# Patient Record
Sex: Female | Born: 1960 | Race: Black or African American | Hispanic: No | Marital: Single | Smoking: Current every day smoker
Health system: Southern US, Community
[De-identification: ages and names within clinical notes are randomized; demographics above are authoritative.]

## PROBLEM LIST (undated history)

## (undated) DIAGNOSIS — I2510 Atherosclerotic heart disease of native coronary artery without angina pectoris: Secondary | ICD-10-CM

## (undated) DIAGNOSIS — I252 Old myocardial infarction: Secondary | ICD-10-CM

## (undated) DIAGNOSIS — I10 Essential (primary) hypertension: Secondary | ICD-10-CM

## (undated) HISTORY — PX: CARPAL TUNNEL RELEASE: SHX101

## (undated) HISTORY — PX: ABDOMINAL HYSTERECTOMY: SHX81

---

## 2005-02-16 ENCOUNTER — Other Ambulatory Visit: Payer: Self-pay

## 2005-02-16 ENCOUNTER — Emergency Department: Payer: Self-pay | Admitting: Emergency Medicine

## 2005-09-02 ENCOUNTER — Emergency Department: Payer: Self-pay | Admitting: Emergency Medicine

## 2005-09-02 ENCOUNTER — Other Ambulatory Visit: Payer: Self-pay

## 2006-06-26 ENCOUNTER — Emergency Department: Payer: Self-pay | Admitting: Internal Medicine

## 2006-10-30 ENCOUNTER — Emergency Department: Payer: Self-pay | Admitting: General Practice

## 2007-06-03 ENCOUNTER — Emergency Department: Payer: Self-pay | Admitting: Emergency Medicine

## 2007-06-03 ENCOUNTER — Other Ambulatory Visit: Payer: Self-pay

## 2007-06-19 ENCOUNTER — Emergency Department: Payer: Self-pay | Admitting: Emergency Medicine

## 2007-06-19 ENCOUNTER — Other Ambulatory Visit: Payer: Self-pay

## 2008-03-06 ENCOUNTER — Emergency Department: Payer: Self-pay | Admitting: Emergency Medicine

## 2008-03-18 ENCOUNTER — Emergency Department: Payer: Self-pay | Admitting: Emergency Medicine

## 2008-12-02 ENCOUNTER — Emergency Department: Payer: Self-pay | Admitting: Emergency Medicine

## 2009-01-30 ENCOUNTER — Emergency Department: Payer: Self-pay | Admitting: Emergency Medicine

## 2009-12-03 ENCOUNTER — Emergency Department: Payer: Self-pay | Admitting: Emergency Medicine

## 2010-01-13 ENCOUNTER — Emergency Department: Payer: Self-pay | Admitting: Emergency Medicine

## 2010-03-02 ENCOUNTER — Emergency Department: Payer: Self-pay | Admitting: Emergency Medicine

## 2010-08-10 ENCOUNTER — Ambulatory Visit: Payer: Self-pay | Admitting: Internal Medicine

## 2010-08-18 ENCOUNTER — Ambulatory Visit: Payer: Self-pay | Admitting: Internal Medicine

## 2010-09-16 ENCOUNTER — Ambulatory Visit: Payer: Self-pay | Admitting: Internal Medicine

## 2011-05-28 ENCOUNTER — Emergency Department: Payer: Self-pay | Admitting: *Deleted

## 2011-11-01 ENCOUNTER — Emergency Department: Payer: Self-pay | Admitting: Emergency Medicine

## 2012-06-20 ENCOUNTER — Emergency Department: Payer: Self-pay | Admitting: Emergency Medicine

## 2012-07-16 ENCOUNTER — Emergency Department: Payer: Self-pay | Admitting: Emergency Medicine

## 2012-07-17 ENCOUNTER — Emergency Department: Payer: Self-pay | Admitting: Emergency Medicine

## 2012-07-17 LAB — COMPREHENSIVE METABOLIC PANEL
Alkaline Phosphatase: 86 U/L (ref 50–136)
BUN: 10 mg/dL (ref 7–18)
Bilirubin,Total: 0.3 mg/dL (ref 0.2–1.0)
Calcium, Total: 9.4 mg/dL (ref 8.5–10.1)
Chloride: 107 mmol/L (ref 98–107)
Co2: 27 mmol/L (ref 21–32)
EGFR (Non-African Amer.): >60
Potassium: 4.1 mmol/L (ref 3.5–5.1)
Sodium: 139 mmol/L (ref 136–145)
Total Protein: 7.3 g/dL (ref 6.4–8.2)

## 2012-07-17 LAB — CBC
HCT: 37.1 % (ref 35.0–47.0)
HGB: 12.3 g/dL (ref 12.0–16.0)
MCH: 29.8 pg (ref 26.0–34.0)
MCHC: 33.1 g/dL (ref 32.0–36.0)
MCV: 90 fL (ref 80–100)
Platelet: 338 10*3/uL (ref 150–440)
RBC: 4.12 10*6/uL (ref 3.80–5.20)
WBC: 12.9 10*3/uL — ABNORMAL HIGH (ref 3.6–11.0)

## 2012-09-06 ENCOUNTER — Emergency Department: Payer: Self-pay | Admitting: Internal Medicine

## 2012-09-06 LAB — CBC
HGB: 13.1 g/dL (ref 12.0–16.0)
MCH: 30.5 pg (ref 26.0–34.0)
MCHC: 34 g/dL (ref 32.0–36.0)
Platelet: 405 10*3/uL (ref 150–440)
RBC: 4.3 10*6/uL (ref 3.80–5.20)
RDW: 14.1 % (ref 11.5–14.5)
WBC: 11.4 10*3/uL — ABNORMAL HIGH (ref 3.6–11.0)

## 2012-09-06 LAB — COMPREHENSIVE METABOLIC PANEL
Albumin: 4.1 g/dL (ref 3.4–5.0)
Alkaline Phosphatase: 81 U/L (ref 50–136)
BUN: 7 mg/dL (ref 7–18)
Bilirubin,Total: 0.3 mg/dL (ref 0.2–1.0)
Calcium, Total: 9.8 mg/dL (ref 8.5–10.1)
Co2: 25 mmol/L (ref 21–32)
EGFR (Non-African Amer.): >60
Glucose: 92 mg/dL (ref 65–99)
Osmolality: 268 (ref 275–301)
Potassium: 3.5 mmol/L (ref 3.5–5.1)
SGOT(AST): 17 U/L (ref 15–37)
Total Protein: 8.2 g/dL (ref 6.4–8.2)

## 2012-09-06 LAB — TROPONIN I: Troponin-I: <0.02 ng/mL

## 2012-11-09 ENCOUNTER — Emergency Department: Payer: Self-pay | Admitting: Emergency Medicine

## 2012-11-09 LAB — CBC
MCH: 30.6 pg (ref 26.0–34.0)
MCHC: 33.8 g/dL (ref 32.0–36.0)
MCV: 91 fL (ref 80–100)
Platelet: 379 10*3/uL (ref 150–440)
RBC: 4.42 10*6/uL (ref 3.80–5.20)
RDW: 13.9 % (ref 11.5–14.5)

## 2013-01-30 ENCOUNTER — Emergency Department: Payer: Self-pay | Admitting: Emergency Medicine

## 2013-01-30 LAB — URINALYSIS, COMPLETE
Bilirubin,UR: NEGATIVE
Ketone: NEGATIVE
Nitrite: NEGATIVE
Protein: NEGATIVE
RBC,UR: 3 /HPF (ref 0–5)
Specific Gravity: 1.026 (ref 1.003–1.030)
Squamous Epithelial: 6
WBC UR: 6 /HPF (ref 0–5)

## 2013-01-30 LAB — COMPREHENSIVE METABOLIC PANEL
Albumin: 3.8 g/dL (ref 3.4–5.0)
Alkaline Phosphatase: 82 U/L (ref 50–136)
Anion Gap: 9 (ref 7–16)
BUN: 11 mg/dL (ref 7–18)
Chloride: 103 mmol/L (ref 98–107)
Co2: 24 mmol/L (ref 21–32)
Creatinine: 0.96 mg/dL (ref 0.60–1.30)
EGFR (African American): >60
Osmolality: 275 (ref 275–301)
Potassium: 2.9 mmol/L — ABNORMAL LOW (ref 3.5–5.1)
SGOT(AST): 22 U/L (ref 15–37)
SGPT (ALT): 19 U/L (ref 12–78)
Sodium: 136 mmol/L (ref 136–145)
Total Protein: 7.8 g/dL (ref 6.4–8.2)

## 2013-01-30 LAB — CK TOTAL AND CKMB (NOT AT ARMC)
CK, Total: 71 U/L (ref 21–215)
CK-MB: <0.5 ng/mL — ABNORMAL LOW (ref 0.5–3.6)

## 2013-01-30 LAB — CBC
HGB: 12.5 g/dL (ref 12.0–16.0)
MCH: 30.4 pg (ref 26.0–34.0)
MCV: 90 fL (ref 80–100)
RDW: 15 % — ABNORMAL HIGH (ref 11.5–14.5)

## 2013-01-30 LAB — TROPONIN I: Troponin-I: <0.02 ng/mL

## 2013-06-05 ENCOUNTER — Emergency Department: Payer: Self-pay | Admitting: Internal Medicine

## 2013-10-02 ENCOUNTER — Emergency Department: Payer: Self-pay | Admitting: Emergency Medicine

## 2014-01-19 ENCOUNTER — Inpatient Hospital Stay: Payer: Self-pay | Admitting: Internal Medicine

## 2014-01-19 LAB — COMPREHENSIVE METABOLIC PANEL
ALBUMIN: 3.9 g/dL (ref 3.4–5.0)
ALT: 23 U/L (ref 12–78)
Alkaline Phosphatase: 69 U/L
Anion Gap: 7 (ref 7–16)
BUN: 14 mg/dL (ref 7–18)
Bilirubin,Total: 0.7 mg/dL (ref 0.2–1.0)
CALCIUM: 9.7 mg/dL (ref 8.5–10.1)
CO2: 26 mmol/L (ref 21–32)
CREATININE: 0.69 mg/dL (ref 0.60–1.30)
Chloride: 106 mmol/L (ref 98–107)
EGFR (African American): >60
EGFR (Non-African Amer.): >60
GLUCOSE: 113 mg/dL — AB (ref 65–99)
Osmolality: 279 (ref 275–301)
Potassium: 3.5 mmol/L (ref 3.5–5.1)
SGOT(AST): 28 U/L (ref 15–37)
SODIUM: 139 mmol/L (ref 136–145)
Total Protein: 8.1 g/dL (ref 6.4–8.2)

## 2014-01-19 LAB — DIFFERENTIAL
BASOS ABS: 0.1 10*3/uL (ref 0.0–0.1)
BASOS PCT: 0.6 %
Eosinophil #: 0.1 10*3/uL (ref 0.0–0.7)
Eosinophil %: 0.9 %
LYMPHS ABS: 3.2 10*3/uL (ref 1.0–3.6)
Lymphocyte %: 22.1 %
MONO ABS: 1.3 x10 3/mm — AB (ref 0.2–0.9)
MONOS PCT: 8.9 %
NEUTROS ABS: 9.6 10*3/uL — AB (ref 1.4–6.5)
NEUTROS PCT: 67.5 %

## 2014-01-19 LAB — DRUG SCREEN, URINE
Amphetamines, Ur Screen: NEGATIVE (ref ?–1000)
BARBITURATES, UR SCREEN: NEGATIVE (ref ?–200)
Benzodiazepine, Ur Scrn: NEGATIVE (ref ?–200)
Cannabinoid 50 Ng, Ur ~~LOC~~: NEGATIVE (ref ?–50)
Cocaine Metabolite,Ur ~~LOC~~: NEGATIVE (ref ?–300)
MDMA (ECSTASY) UR SCREEN: NEGATIVE (ref ?–500)
METHADONE, UR SCREEN: NEGATIVE (ref ?–300)
OPIATE, UR SCREEN: NEGATIVE (ref ?–300)
Phencyclidine (PCP) Ur S: NEGATIVE (ref ?–25)
Tricyclic, Ur Screen: NEGATIVE (ref ?–1000)

## 2014-01-19 LAB — URINALYSIS, COMPLETE
Bacteria: NEGATIVE
Bilirubin,UR: NEGATIVE
GLUCOSE, UR: NEGATIVE mg/dL (ref 0–75)
Ketone: NEGATIVE
Leukocyte Esterase: NEGATIVE
NITRITE: NEGATIVE
PH: 7 (ref 4.5–8.0)
PROTEIN: NEGATIVE
RBC,UR: NONE SEEN /HPF (ref 0–5)
Specific Gravity: 1.004 (ref 1.003–1.030)

## 2014-01-19 LAB — CBC
HCT: 38 % (ref 35.0–47.0)
HGB: 12.9 g/dL (ref 12.0–16.0)
MCH: 31.3 pg (ref 26.0–34.0)
MCHC: 34 g/dL (ref 32.0–36.0)
MCV: 92 fL (ref 80–100)
Platelet: 370 10*3/uL (ref 150–440)
RBC: 4.12 10*6/uL (ref 3.80–5.20)
RDW: 14.4 % (ref 11.5–14.5)
WBC: 14.2 10*3/uL — ABNORMAL HIGH (ref 3.6–11.0)

## 2014-01-19 LAB — CK-MB
CK-MB: 9.1 ng/mL — ABNORMAL HIGH (ref 0.5–3.6)
CK-MB: 7.9 ng/mL — ABNORMAL HIGH (ref 0.5–3.6)
CK-MB: 5.6 ng/mL — ABNORMAL HIGH (ref 0.5–3.6)

## 2014-01-19 LAB — TROPONIN I
TROPONIN-I: 0.79 ng/mL — AB
TROPONIN-I: 0.77 ng/mL — AB
Troponin-I: 0.74 ng/mL — ABNORMAL HIGH

## 2014-01-19 LAB — LIPID PANEL
CHOLESTEROL: 188 mg/dL (ref 0–200)
HDL: 48 mg/dL (ref 40–60)
LDL CHOLESTEROL, CALC: 122 mg/dL — AB (ref 0–100)
Triglycerides: 88 mg/dL (ref 0–200)
VLDL Cholesterol, Calc: 18 mg/dL (ref 5–40)

## 2014-01-19 LAB — HEMOGLOBIN A1C: Hemoglobin A1C: 5.9 % (ref 4.2–6.3)

## 2014-01-19 LAB — PROTIME-INR
INR: 1.1
Prothrombin Time: 13.6 secs (ref 11.5–14.7)

## 2014-01-19 LAB — HEPARIN LEVEL (UNFRACTIONATED): Anti-Xa(Unfractionated): 0.45 IU/mL (ref 0.30–0.70)

## 2014-01-19 LAB — APTT: Activated PTT: 33.6 secs (ref 23.6–35.9)

## 2014-01-19 LAB — TSH: THYROID STIMULATING HORM: 0.029 u[IU]/mL — AB

## 2014-01-19 LAB — CK: CK, Total: 143 U/L

## 2014-01-20 LAB — CBC WITH DIFFERENTIAL/PLATELET
BASOS ABS: 0.1 10*3/uL (ref 0.0–0.1)
Basophil %: 0.8 %
EOS ABS: 0.2 10*3/uL (ref 0.0–0.7)
Eosinophil %: 1.9 %
HCT: 33.7 % — ABNORMAL LOW (ref 35.0–47.0)
HGB: 11.5 g/dL — AB (ref 12.0–16.0)
LYMPHS ABS: 4 10*3/uL — AB (ref 1.0–3.6)
LYMPHS PCT: 38.4 %
MCH: 31.6 pg (ref 26.0–34.0)
MCHC: 34 g/dL (ref 32.0–36.0)
MCV: 93 fL (ref 80–100)
MONO ABS: 1 x10 3/mm — AB (ref 0.2–0.9)
Monocyte %: 9.7 %
NEUTROS ABS: 5.1 10*3/uL (ref 1.4–6.5)
Neutrophil %: 49.2 %
Platelet: 299 10*3/uL (ref 150–440)
RBC: 3.62 10*6/uL — ABNORMAL LOW (ref 3.80–5.20)
RDW: 14.6 % — ABNORMAL HIGH (ref 11.5–14.5)
WBC: 10.3 10*3/uL (ref 3.6–11.0)

## 2014-01-20 LAB — COMPREHENSIVE METABOLIC PANEL
ALBUMIN: 3.3 g/dL — AB (ref 3.4–5.0)
ANION GAP: 7 (ref 7–16)
AST: 21 U/L (ref 15–37)
Alkaline Phosphatase: 58 U/L
BUN: 15 mg/dL (ref 7–18)
Bilirubin,Total: 0.5 mg/dL (ref 0.2–1.0)
CO2: 25 mmol/L (ref 21–32)
CREATININE: 0.71 mg/dL (ref 0.60–1.30)
Calcium, Total: 9.1 mg/dL (ref 8.5–10.1)
Chloride: 107 mmol/L (ref 98–107)
EGFR (African American): >60
EGFR (Non-African Amer.): >60
GLUCOSE: 97 mg/dL (ref 65–99)
OSMOLALITY: 278 (ref 275–301)
Potassium: 3.8 mmol/L (ref 3.5–5.1)
SGPT (ALT): 26 U/L (ref 12–78)
Sodium: 139 mmol/L (ref 136–145)
TOTAL PROTEIN: 6.9 g/dL (ref 6.4–8.2)

## 2014-01-20 LAB — HEPARIN LEVEL (UNFRACTIONATED): ANTI-XA(UNFRACTIONATED): 0.35 [IU]/mL (ref 0.30–0.70)

## 2014-01-21 LAB — CREATININE, SERUM
Creatinine: 0.77 mg/dL (ref 0.60–1.30)
EGFR (African American): >60

## 2014-01-21 LAB — HEMOGLOBIN: HGB: 10.8 g/dL — AB (ref 12.0–16.0)

## 2014-01-23 ENCOUNTER — Ambulatory Visit: Payer: Self-pay | Admitting: Internal Medicine

## 2014-07-16 IMAGING — CT CT HEAD WITHOUT CONTRAST
1 series · 16 of 30 positions shown, 20 images · non-contrast
Comparison: none

REASON FOR EXAM: severe htn, headache
COMMENTS:

[Series 2: soft tissue · axial · 0.42mm/px · z∈[-165,-25]mm · 16 of 32 slices shown, 20 images]
[im 2/32  brain]
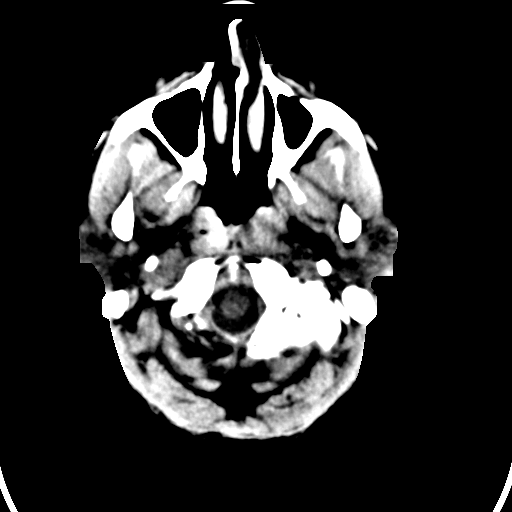
[im 2/32  bone]
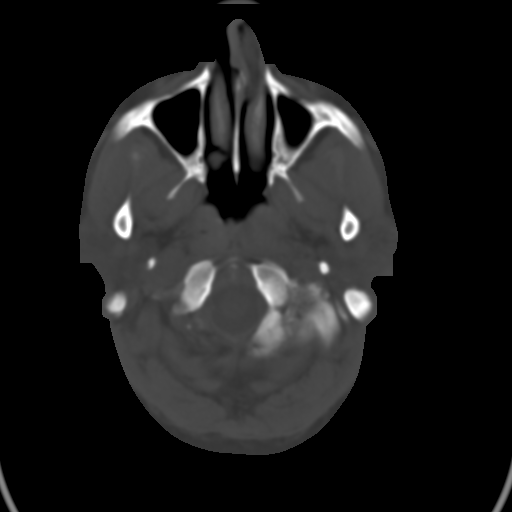
[im 4/32  brain]
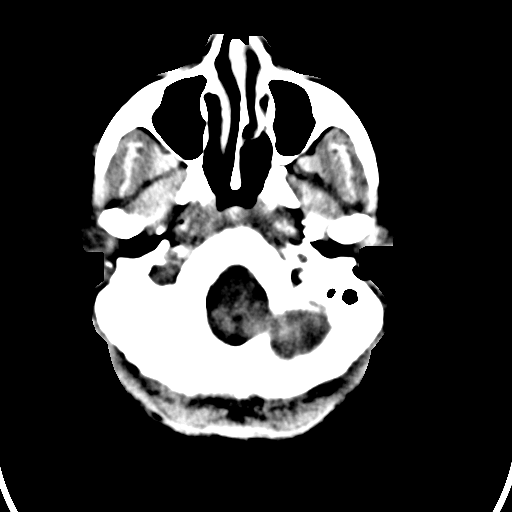
[im 6/32  brain]
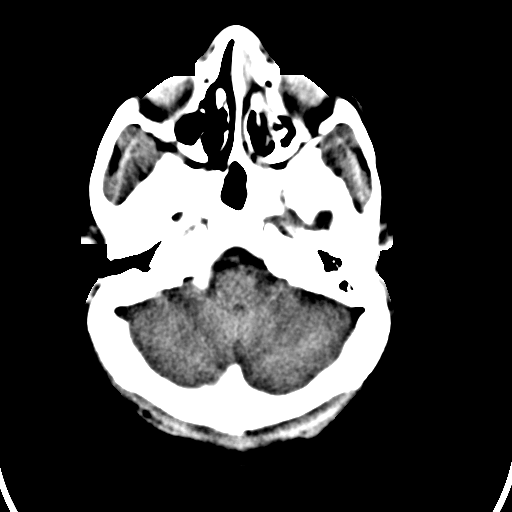
[im 8/32  brain]
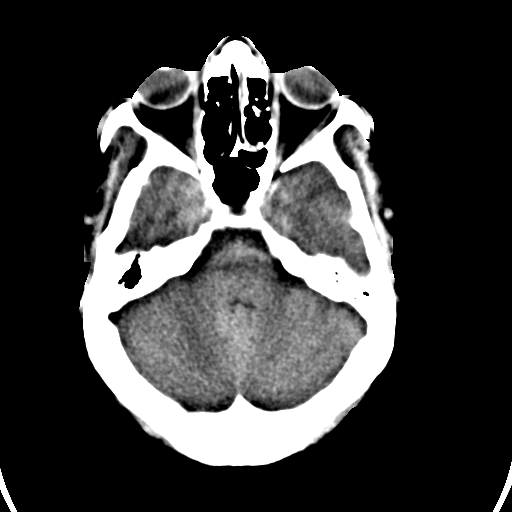
[im 9/32  brain]
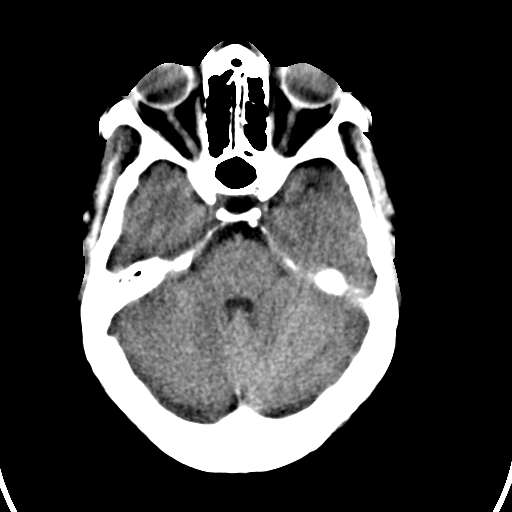
[im 9/32  bone]
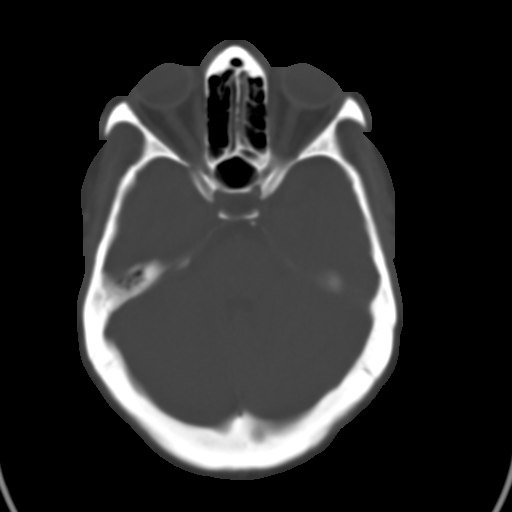
[im 11/32  brain]
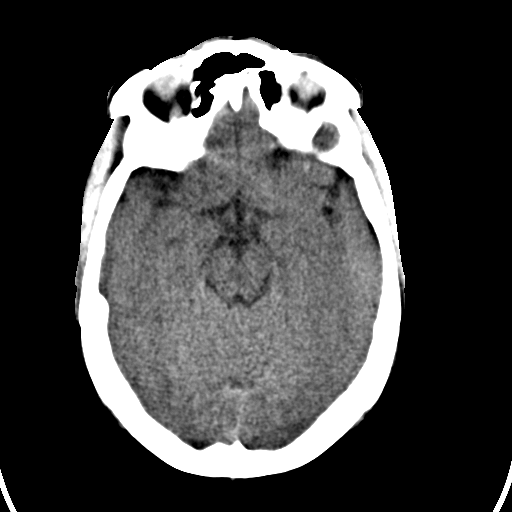
[im 13/32  brain]
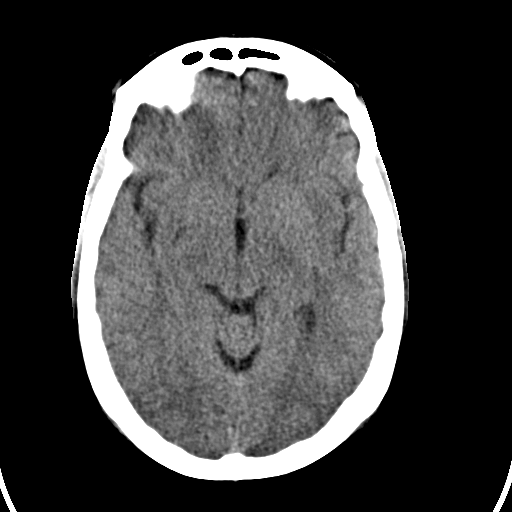
[im 15/32  brain]
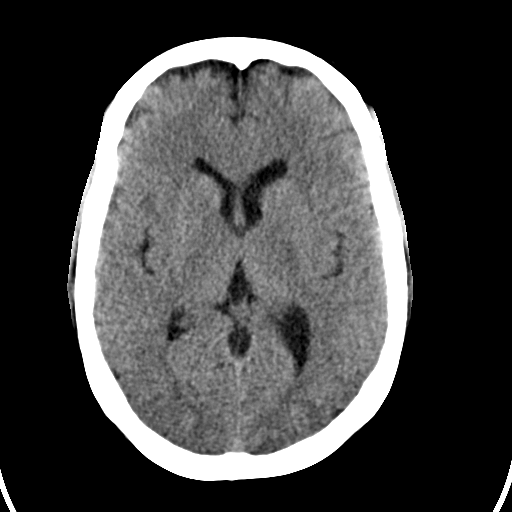
[im 17/32  brain]
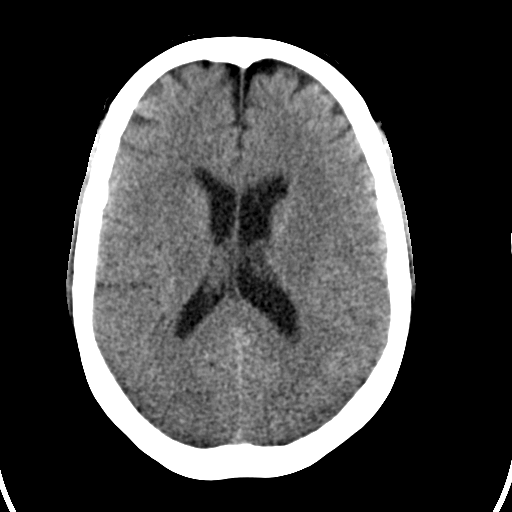
[im 17/32  bone]
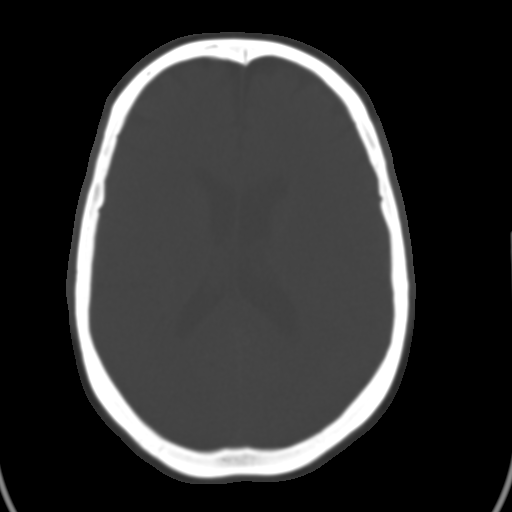
[im 19/32  brain]
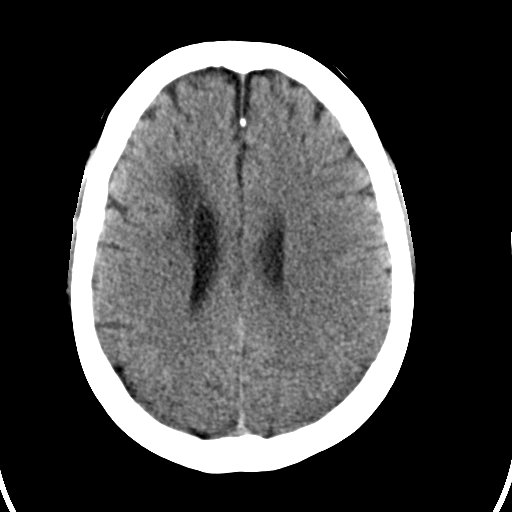
[im 21/32  brain]
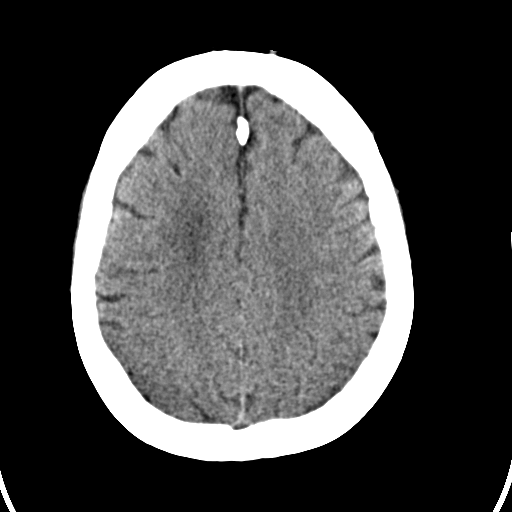
[im 23/32  brain]
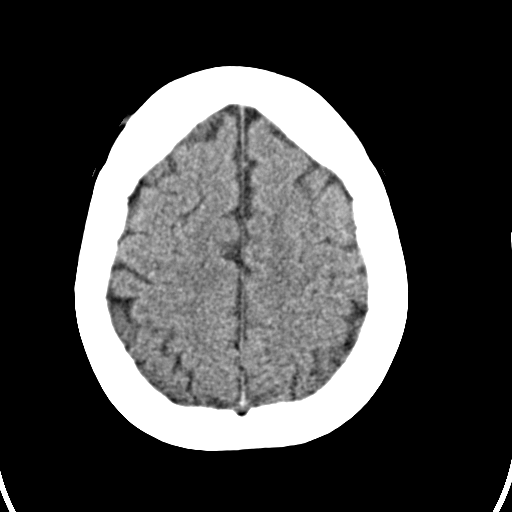
[im 24/32  brain]
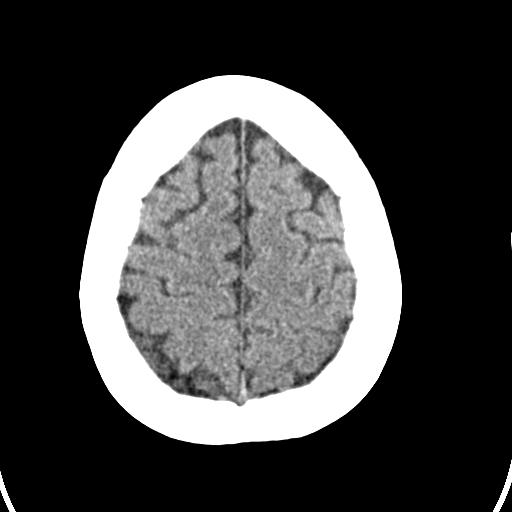
[im 24/32  bone]
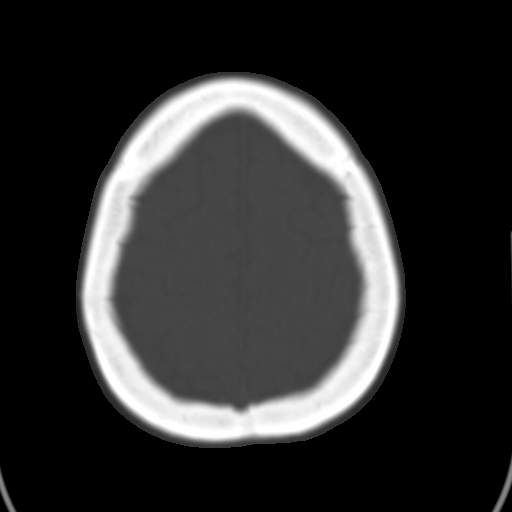
[im 26/32  brain]
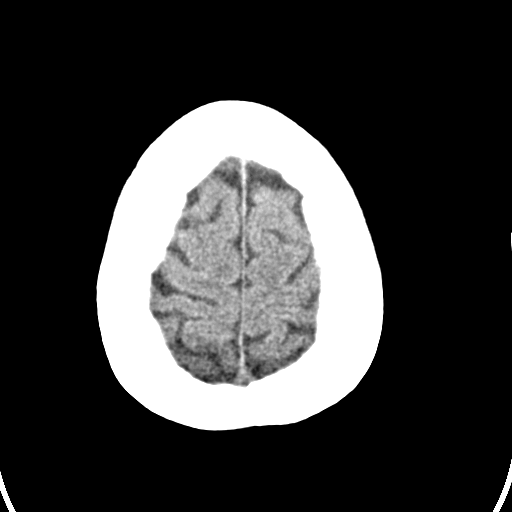
[im 28/32  brain]
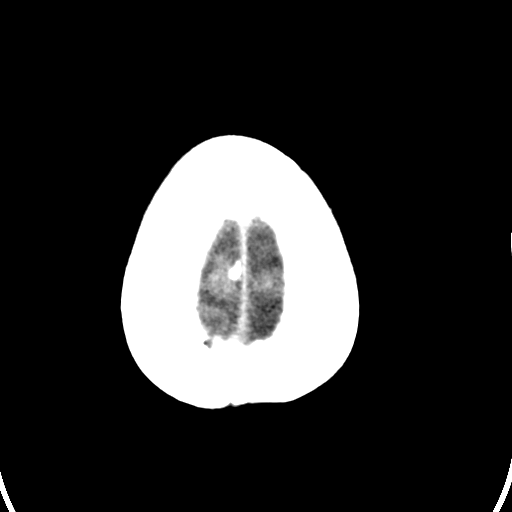
[im 30/32  brain]
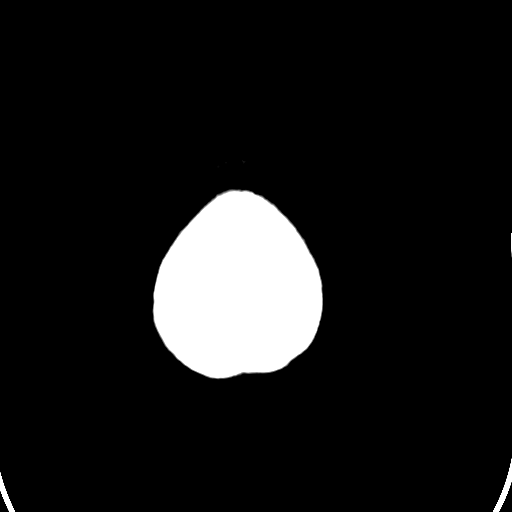

[16 of 30 positions shown; findings below may reference images not displayed]

PROCEDURE:     CT  - CT HEAD WITHOUT CONTRAST  - July 16, 2012  [DATE]

RESULT:     Noncontrast emergent CT is performed in the standard fashion.
The patient has a previous exam dated 03 June, 2007.

There is low-attenuation adjacent to the anterior horn the right lateral
ventricle and adjacent to the body the right lateral ventricle in in the
right frontal region ACA territory. The ventricles and sulci are otherwise
within normal limits. There is some mucosal thickening in the ethmoid air
cells bilaterally. There is no depressed skull fracture. The sinuses and
mastoids are otherwise clear.
IMPRESSION: 1. Low-attenuation area in the periventricular white matter suspicious for
prior infarct. Correlate with history. This was present and appears
unchanged compared to the study 03 June, 1999.

[REDACTED]

## 2014-08-01 DIAGNOSIS — F17200 Nicotine dependence, unspecified, uncomplicated: Secondary | ICD-10-CM | POA: Insufficient documentation

## 2014-08-01 DIAGNOSIS — I10 Essential (primary) hypertension: Secondary | ICD-10-CM | POA: Insufficient documentation

## 2014-08-01 DIAGNOSIS — I25110 Atherosclerotic heart disease of native coronary artery with unstable angina pectoris: Secondary | ICD-10-CM | POA: Insufficient documentation

## 2014-11-04 DIAGNOSIS — E782 Mixed hyperlipidemia: Secondary | ICD-10-CM | POA: Insufficient documentation

## 2014-11-08 NOTE — H&P (Signed)
PATIENT NAME:  Carla [name], Carla [name] MR#:  [id number] DATE OF BIRTH:  May 09, 1961  DATE OF ADMISSION:  01/19/2014  PRIMARY CARE PHYSICIAN: Corky Downs, MD  HISTORY OF PRESENT ILLNESS:  The patient is a 54 year old African American female with history of migraine headaches in the past, history of hypertension as well as gastroesophageal reflux disease, who presents to the hospital with complaints of headaches. The patient stated her headache started while she was at work spooling in a [employer] on Thursday, which was 4 days ago. She started having headaches in frontal area. Pain was described as sharp to achy pain, 10 out of 10 by intensity, intermittent pain but she denied any throbbing sensation and it was not unilateral. She tried Tylenol with some help as well as Aleve. She also took some aspirin intermittently. Pain would increase whenever she walks around, however, she denied any nausea, vomiting, lightheadedness or dizziness, or any other associated symptoms. Today, however, she started having numbness in the left arm, in the inner surface of her left arm as well as some tingling sensation in her fingers. She denied any weakness of that arm and it lasted approximately 5 minutes. There was no sensitivity to light in regards to her headaches and numbness in her arm came in together with headache. She used to have migraines in the past but it was more than 10 years ago but she never had this kind of intense discomfort which this time. She came to the Emergency Room. She had CT scan of her head done which was unremarkable. She also had her labs taken which revealed elevation of troponin as well as MB fraction and hospitalist services were contacted for admission. The patient's EKG reveals only sinus rhythm, tachycardia, rate of 102 with no significant ST-T changes.   PAST MEDICAL HISTORY:  Significant for history of migraine headaches, hypertension, gastroesophageal reflux disease.   MEDICATIONS: The  patient is on Zoloft 5 mg p.o. daily, Zantac 150 mg p.o. twice daily.   PAST SURGICAL HISTORY:  C-section x 2 in 1987 and 1983, and hysterectomy in 1999.   ALLERGIES: None.   FAMILY HISTORY: Hypertension in the patient's parents. Stroke in the patient's mother, who was younger at 68 years old when she had stroke and diabetes in the patient's mother. Cancer in paternal side, breast cancer.   SOCIAL HISTORY: The patient is single, has 1 child. The child was premature born and he died. Smokes 1-1/2 a pack per day for approximately 5 to 6 years now. Drinks alcohol occasionally socially. She works in a textile, Recruitment consultant.  REVIEW OF SYSTEMS:  CONSTITUTIONAL:  Positive for pains in the head, some snoring; however, but she works third shift and she feels that it is related to her work schedule. Admits of no chest pains. Denies any fevers, chills, fatigue, weakness, or weight loss or gain.  EYES: Denies any blurry vision, double vision, glaucoma or cataracts.  ENT: Denies any tinnitus, allergies, epistaxis, sinus pain, dentures, difficulty swallowing.   RESPIRATORY:  Denies any cough, wheezes, asthma, chronic obstructive pulmonary disease.  CARDIOVASCULAR: Denies chest pains, orthopnea, arrhythmias, palpitations or syncope.  GASTROINTESTINAL:  Denies any nausea, vomiting, diarrhea or constipation.  GENITOURINARY: Denies dysuria, hematuria, frequency, incontinence.  ENDOCRINOLOGY:  Denies any polydipsia, nocturia, thyroid problems, heat or cold intolerance or thirst.  HEMATOLOGIC: Denies anemia, easy bruising, bleeding or swollen glands.   SKIN: Denies any acne, rashes, lesions or change in moles.  MUSCULOSKELETAL: Denies arthritis, cramps, swelling, gout.  NEUROLOGICAL:  Denies numbness, epilepsy, tremor. PSYCHIATRIC:  The patient denies anxiety, insomnia, depression.   PHYSICAL EXAMINATION:   VITAL SIGNS:  On arrival to the hospital, temperature was 98.2, pulse 103, respirations 20, blood  pressure 120/60, saturation 100% on room air to my evaluation. During my evaluation, her heart rate is 93, blood pressure 139/73, respiratory rate was 15 and oxygen saturation was 100% on room air.  GENERAL: This is a well-developed, well-nourished African American female in no significant distress, sitting on the stretcher.  HEENT: Her pupils are equal and reactive to light.  Extraocular movements intact.  no icterus or conjunctivitis. Has normal hearing. No pharyngeal erythema. Mucosa is moist.  NECK: No masses. Supple, nontender. Thyroid is not enlarged. No adenopathy. No JVD or carotid bruits bilaterally. Full range of motion.  LUNGS: Clear to auscultation all fields. No rales, rhonchi, diminished breath sounds or wheezing.  No labored inspirations, increased effort. No dullness to percussion, no apparent respiratory distress.  CARDIOVASCULAR: S1, S2 present.  No murmurs, rubs or gallops.  Heart sounds not distant. PMI not lateralized. Chest is nontender to palpation.  Has 1+ pedal pulses. No lower extremity edema, calf tenderness or cyanosis was noted.  ABDOMEN: Soft, nontender. Bowel sounds are present. No hepatosplenomegaly or masses were noted.  RECTAL: Deferred.  MUSCLE STRENGTH: Able to move all extremities. No cyanosis, degenerative joint disease or kyphosis. Gait was not tested.  SKIN: Did not reveal any rash, lesions, erythema, nodularity or induration. It was warm and dry to palpation.  LYMPHATIC: No adenopathy in the cervical region.  NEUROLOGIC: Cranial nerves grossly intact. Sensory is intact.  No dysarthria or aphasia. The patient is alert, oriented to time, person and place, cooperative. Memory is good.  PSYCHIATRIC: No significant confusion, agitation, depression. The patient is somewhat stressed out and tearful.   LABORATORY, DIAGNOSTIC AND RADIOLOGICAL DATA:  Glucose 113, otherwise BMP was normal. Liver enzymes were normal. CK total of 143, MB fraction 9.1 and troponin 0.74. White  blood cell count is elevated to 14.2, hemoglobin 12.9, platelet count 270. Urinalysis was remarkable for straw-colored urine, negative for glucose, bilirubin or ketones, specific gravity 1.004, pH was 7.0, 1+ blood, negative for protein, nitrites or leukocyte esterase, 0 to 5 white blood cells, 0 to 5 epithelial cells were also noted. Radiologic studies: CT scan of head without and without contrast, 5th of July 2015, showed chronic changes without acute abnormality. Chest x-ray is pending. EKG showed sinus tach at 102 beats per minute, normal axis, left atrial enlargement, no acute ST-T changes were noted.   ASSESSMENT AND PLAN: 1.  Non-Q-wave myocardial infarction. Admit the patient to the medical floor. Heparin IV is going to be given. Nitroglycerin as well as metoprolol, aspirin. We will check cardiac enzymes x 3. Cardiology consultation requested. I discussed case with Dr. Gwen PoundsKowalski and Dr. Veva HolesVazquez concerning cardiac catheterization.   2.  Hypertension. We will continue nitroglycerin as well as metoprolol. We will hold the patient's blood pressure medications at present.  3.  Hyperglycemia. Will get hemoglobin A1c.   4.  Leukocytosis, questionable stress related. We will get urinalysis as well as chest x-ray.  5.  Tobacco abuse. Discussed with patient for approximately 5 minutes. Nicotine replacement will be initiated.   TIME SPENT: 50 minutes on this patient.    ____________________________ Katharina Caperima Kirstine Jacquin, MD rv:cs D: 01/19/2014 14:24:23 ET T: 01/19/2014 15:02:27 ET JOB#: [id number]  cc: Katharina Caperima Deyci Gesell, MD, <Dictator> Corky DownsJaved Masoud, MD Mariadel Mruk MD ELECTRONICALLY SIGNED 02/06/2014 16:20

## 2014-11-08 NOTE — Discharge Summary (Signed)
PATIENT NAME:  Carla [name], Carla [name] MR#:  [id number] DATE OF BIRTH:  03/25/1961  DATE OF ADMISSION:  01/19/2014. DATE OF DISCHARGE:  01/21/2014.  ADMITTING DIAGNOSIS: Chest pain.   DISCHARGE DIAGNOSES:  1.  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 Medical management recommended.  2.  Hypertension.  3.  Low thyroid stimulating hormone, needs outpatient followup with repeat.  4.  Tobacco abuse.  5.  Headache during hospitalization.  History of migraine headaches.  6.  Gastroesophageal reflux disease.   PERTINENT LABORATORY DATA AND EVALUATIONS: Admitting glucose 113, LFTs were normal. CPK 143, CK-MB 9.1, troponin 0.74. WBC count was 14.2, hemoglobin 12.9, platelet count 270,000, urinalysis showed straw-colored urine, negative for glucose.   CT scan of the head without contrast showed chronic changes without acute abnormality.   EKG shows sinus tachycardia with left atrial enlargement. No ST-T wave changes.   CONSULTANTS: Dr. Gwen PoundsKowalski. Cardiac catheterization showed coronary circulation right dominant, and in ostial RCA there was 60% stenosis.   HOSPITAL COURSE: Please refer to H and P done by the admitting physician. The patient is [name] 54 year old African American female with history of hypertension and migraine headaches, who presented with to the ED with complaint of having chest pain. The patient was noted to have elevated troponin consistent with possible non-ST MI. Due to her symptoms and abnormal cardiac enzymes, as well as multiple risk factors including smoking and hypertension the patient underwent cardiac catheterization after consult with cardiology was obtained. Her cardiac catheterization did show RCA stenosis of 60%, which was recommended to be medically managed. The patient at this time is chest-pain free and is doing well and is stable for discharge with outpatient cardiology followup.    DISCHARGE MEDICATIONS: Zantac 150 mg 1 tablet p.o. b.i.d., isosorbide mononitrate 30 mg 1 tablet p.o. daily, atorvastatin 20 mg 2 daily, aspirin 81 mg 1 tablet p.o. daily, metoprolol tartrate 50 mg 1 tablet p.o. daily. Nicotine 14 mg 1 patch transdermally once daily for 6 weeks.   DIET: Low sodium.   ACTIVITY: As tolerated.   FOLLOWUP: With Dr. Gwen PoundsKowalski as per his direction. Follow with primary care provider in 1 to 2 weeks.   TIME SPENT: 35 minutes.   ____________________________ Lacie ScottsShreyang H. Allena KatzPatel, MD shp:lt D: 01/22/2014 08:23:55 ET T: 01/22/2014 11:48:14 ET JOB#: [id number]  cc: Hoover Grewe H. Allena KatzPatel, MD, <Dictator> Charise CarwinSHREYANG H Georgios Kina MD ELECTRONICALLY SIGNED 01/29/2014 8:34

## 2014-11-08 NOTE — Consult Note (Signed)
PATIENT NAME:  Carla HummingbirdHERBIN, Trisha A MR#:  [id number] DATE OF BIRTH:  [birth year]  DATE OF CONSULTATION:  01/19/2014  REFERRING PHYSICIAN:  Dr. Winona LegatoVaickute  CONSULTING PHYSICIAN:  Lamar BlinksBruce J. Leocadia Idleman, MD  REASON FOR CONSULTATION: Chest pain with proximal subendocardial myocardial infarction.   CHIEF COMPLAINT:  Chest pain.   HISTORY OF PRESENT ILLNESS:  This is a [age] year old heavy smoker who has had some borderline hypertension, hyperlipidemia, strong family history of cardiovascular disease, who has had some significant headache and hypertension as well as mild amount of left arm tingling and discomfort and a mild amount of chest discomfort, but this is waxing and waning. The patient has had some relief, but no evidence of significant EKG changes with normal sinus rhythm with a right bundle branch block. The patient does have troponin of .95  for elevated higher with concerns of myocardial infarction, although elevation is not significant. The patient now feels fine with no further evidence of significant symptoms.  Remainder of review of systems negative for vision change, ringing in the ears, hearing loss, cough, congestion, heartburn, nausea, vomiting, diarrhea, bloody stool, stomach pain, extremity pain, leg weakness, cramping in buttocks, known blood clots, headaches, blackouts, dizzy spells, nosebleeds, congestion, trouble swallowing, frequent urination.   SOCIAL HISTORY: Smokes 2 packs per day. Does not have alcohol.   ALLERGIES:  PATIENT HAS ALLERGIES TO MEDICATIONS AS LISTED.   PHYSICAL EXAMINATION:  VITAL SIGNS: Blood pressure is 136/68 bilaterally. Heart rate 72 upright, reclining, and regular.  GENERAL: She is a well-appearing female in no acute distress.  HEENT: No icterus, thyromegaly, ulcers, hemorrhage or xanthelasma.  CARDIOVASCULAR: Regular rate and rhythm. Normal S1 and S2 without murmur, gallop, or rub. PMI is normal size and placement. Carotid upstroke normal without bruit. Jugular  venous pressure is normal.  LUNGS: Clear to auscultation with normal respirations.  ABDOMEN: Soft, nontender, without hepatosplenomegaly or masses. Abdominal aorta is normal size without bruit.  EXTREMITIES: She had 2+ bilateral pulses in dorsal, pedal, radial and femoral arteries without lower extremity edema, cyanosis, clubbing or ulcers.  NEUROLOGIC: She is oriented to time, place, and person, with normal mood and affect.   ASSESSMENT: Middle-aged female with slightly abnormal EKG with elevated troponin and symptoms concerning for cardiovascular disease with significant risk factors. Needing further treatment options.   RECOMMENDATIONS:  1. Heparin. 2. A beta blocker, aspirin if able.  3. Further evaluation with further troponins and possible further intervention including cardiac catheterization. The patient understands the risks and benefits of cardiac catheterization. These includes the possibility of death, stroke, heart attack, infection, blood clot. She is at low risk for conscious sedation.     ____________________________ Lamar BlinksBruce J. Saree Krogh, MD bjk:dd D: 01/19/2014 22:22:43 ET T: 01/20/2014 02:21:14 ET JOB#: [id number]  cc: Lamar BlinksBruce J. Kateena Degroote, MD, <Dictator> Lamar BlinksBRUCE J Bron Snellings MD ELECTRONICALLY SIGNED 01/20/2014 13:23

## 2015-06-19 ENCOUNTER — Encounter: Payer: Self-pay | Admitting: Medical Oncology

## 2015-06-19 ENCOUNTER — Emergency Department
Admission: EM | Admit: 2015-06-19 | Discharge: 2015-06-19 | Disposition: A | Discharge status: Home / Self Care | Payer: BLUE CROSS/BLUE SHIELD | Attending: Emergency Medicine | Admitting: Emergency Medicine

## 2015-06-19 DIAGNOSIS — G5602 Carpal tunnel syndrome, left upper limb: Secondary | ICD-10-CM | POA: Insufficient documentation

## 2015-06-19 DIAGNOSIS — M25532 Pain in left wrist: Secondary | ICD-10-CM | POA: Diagnosis present

## 2015-06-19 DIAGNOSIS — I10 Essential (primary) hypertension: Secondary | ICD-10-CM | POA: Insufficient documentation

## 2015-06-19 HISTORY — DX: Essential (primary) hypertension: I10

## 2015-06-19 HISTORY — DX: Old myocardial infarction: I25.2

## 2015-06-19 MED ORDER — MELOXICAM 15 MG PO TABS: 15.0000 mg | ORAL_TABLET | Freq: Every day | ORAL | Status: DC

## 2015-06-19 NOTE — Discharge Instructions (Signed)
Carpal Tunnel Syndrome Carpal tunnel syndrome is a condition that causes pain in your hand and arm. The carpal tunnel is a narrow area located on the palm side of your wrist. Repeated wrist motion or certain diseases may cause swelling within the tunnel. This swelling pinches the main nerve in the wrist (median nerve). CAUSES  This condition may be caused by:   Repeated wrist motions.  Wrist injuries.  Arthritis.  A cyst or tumor in the carpal tunnel.  Fluid buildup during pregnancy. Sometimes the cause of this condition is not known.  RISK FACTORS This condition is more likely to develop in:   People who have jobs that cause them to repeatedly move their wrists in the same motion, such as butchers and cashiers.  Women.  People with certain conditions, such as:  Diabetes.  Obesity.  An underactive thyroid (hypothyroidism).  Kidney failure. SYMPTOMS  Symptoms of this condition include:   A tingling feeling in your fingers, especially in your thumb, index, and middle fingers.  Tingling or numbness in your hand.  An aching feeling in your entire arm, especially when your wrist and elbow are bent for long periods of time.  Wrist pain that goes up your arm to your shoulder.  Pain that goes down into your palm or fingers.  A weak feeling in your hands. You may have trouble grabbing and holding items. Your symptoms may feel worse during the night.  DIAGNOSIS  This condition is diagnosed with a medical history and physical exam. You may also have tests, including:   An electromyogram (EMG). This test measures electrical signals sent by your nerves into the muscles.  X-rays. TREATMENT  Treatment for this condition includes:  Lifestyle changes. It is important to stop doing or modify the activity that caused your condition.  Physical or occupational therapy.  Medicines for pain and inflammation. This may include medicine that is injected into your wrist.  A wrist  splint.  Surgery. HOME CARE INSTRUCTIONS  If You Have a Splint:  Wear it as told by your health care provider. Remove it only as told by your health care provider.  Loosen the splint if your fingers become numb and tingle, or if they turn cold and blue.  Keep the splint clean and dry. General Instructions  Take over-the-counter and prescription medicines only as told by your health care provider.  Rest your wrist from any activity that may be causing your pain. If your condition is work related, talk to your employer about changes that can be made, such as getting a wrist pad to use while typing.  If directed, apply ice to the painful area:  Put ice in a plastic bag.  Place a towel between your skin and the bag.  Leave the ice on for 20 minutes, 2-3 times per day.  Keep all follow-up visits as told by your health care provider. This is important.  Do any exercises as told by your health care provider, physical therapist, or occupational therapist. SEEK MEDICAL CARE IF:   You have new symptoms.  Your pain is not controlled with medicines.  Your symptoms get worse.   This information is not intended to replace advice given to you by your health care provider. Make sure you discuss any questions you have with your health care provider.   Document Released: 07/01/2000 Document Revised: 03/25/2015 Document Reviewed: 11/19/2014 Elsevier Interactive Patient Education 2016 [employer]  Carpal Tunnel Release Carpal tunnel release is a surgical procedure to relieve  numbness and pain in your hand that are caused by carpal tunnel syndrome. Your carpal tunnel is a narrow, hollow space in your wrist. It passes between your wrist bones and a band of connective tissue (transverse carpal ligament). The nerve that supplies most of your hand (median nerve) passes through this space, and so do the connections between your fingers and the muscles of your arm (tendons). Carpal tunnel syndrome  makes this space swell and become narrow, and this causes pain and numbness. °In carpal tunnel release surgery, a surgeon cuts through the transverse carpal ligament to make more room in the carpal tunnel space. You may have this surgery if other types of treatment have not worked. °LET YOUR HEALTH CARE PROVIDER KNOW ABOUT: °· Any allergies you have. °· All medicines you are taking, including vitamins, herbs, eye drops, creams, and over-the-counter medicines. °· Previous problems you or members of your family have had with the use of anesthetics. °· Any blood disorders you have. °· Previous surgeries you have had. °· Medical conditions you have. °RISKS AND COMPLICATIONS °Generally, this is a safe procedure. However, problems may occur, including: °· Bleeding. °· Infection. °· Injury to the median nerve. °· Need for additional surgery. °BEFORE THE PROCEDURE °· Ask your health care provider about: °¨ Changing or stopping your regular medicines. This is especially important if you are taking diabetes medicines or blood thinners. °¨ Taking medicines such as aspirin and ibuprofen. These medicines can thin your blood. Do not take these medicines before your procedure if your health care provider instructs you not to. °· Do not eat or drink anything after midnight on the night before the procedure or as directed by your health care provider. °· Plan to have someone take you home after the procedure. °PROCEDURE °· An IV tube may be inserted into a vein. °· You will be given one of the following: °¨ A medicine that numbs the wrist area (local anesthetic). You may also be given a medicine to make you relax (sedative). °¨ A medicine that makes you go to sleep (general anesthetic). °· Your arm, hand, and wrist will be cleaned with a germ-killing solution (antiseptic). °· Your surgeon will make a surgical cut (incision) over the palm side of your wrist. The surgeon will pull aside the skin of your wrist to expose the carpal  tunnel space. °· The surgeon will cut the transverse carpal ligament. °· The edges of the incision will be closed with stitches (sutures) or staples. °· A bandage (dressing) will be placed over your wrist and wrapped around your hand and wrist. °AFTER THE PROCEDURE °· You may spend some time in a recovery area. °· Your blood pressure, heart rate, breathing rate, and blood oxygen level will be monitored often until the medicines you were given have worn off. °· You will likely have some pain. You will be given pain medicine. °· You may need to wear a splint or a wrist brace over your dressing. °  °This information is not intended to replace advice given to you by your health care provider. Make sure you discuss any questions you have with your health care provider. °  °Document Released: 09/24/2003 Document Revised: 07/25/2014 Document Reviewed: 02/19/2014 °Elsevier Interactive Patient Education ©2016 [employer]. ° °

## 2015-06-19 NOTE — ED Notes (Signed)
Pt reports that she has been having left wrist pain for about 1 month. States that she has a hx of carpel tunnel to rt wrist and this feels similar.

## 2015-06-19 NOTE — ED Notes (Signed)
Left hand /wrist pain w/o injury   Hx of carpel tunnel in right and pain is to left hand/wrist area

## 2015-06-19 NOTE — ED Provider Notes (Signed)
[facility] Emergency Department Provider Note  ____________________________________________  Time seen: Approximately 12:26 PM  I have reviewed the triage vital signs and the nursing notes.   HISTORY  Chief Complaint Wrist Pain    HPI Carla [name] is a 54 y.o. female who presents to the emergency department for left wrist pain. She states that she had carpal tunnel syndrome in her right hand and feels like symptoms are the same in her left. She endorses burning sensation with mild numbness and tingling in her digits. 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 She is tried over-the-counter medications with no relief. She states pain is constant, worse with movement or grasping objects, described as a burning sensation.   Past Medical History  Diagnosis Date  . Hypertension   . Myocardial infarct, old     There are no active problems to display for this patient.   Past Surgical History  Procedure Laterality Date  . Carpal tunnel release      Current Outpatient Rx  Name  Route  Sig  Dispense  Refill  . meloxicam (MOBIC) 15 MG tablet   Oral   Take 1 tablet (15 mg total) by mouth daily.   30 tablet   0     Allergies Review of patient's allergies indicates no known allergies.  No family history on file.  Social History Social History  Substance Use Topics  . Smoking status: None  . Smokeless tobacco: None  . Alcohol Use: None    Review of Systems Constitutional: No fever/chills Eyes: No visual changes. ENT: No sore throat. Cardiovascular: Denies chest pain. Respiratory: Denies shortness of breath. Gastrointestinal: No abdominal pain.  No nausea, no vomiting.  No diarrhea.  No constipation. Genitourinary: Negative for dysuria. Musculoskeletal: Negative for back pain. Dorsiflex his left wrist pain. Skin: Negative for rash. Neurological: Negative for headaches, focal weakness or  numbness.  10-point ROS otherwise negative.  ____________________________________________   PHYSICAL EXAM:  VITAL SIGNS: ED Triage Vitals  Enc Vitals Group     BP 06/19/15 1100 119/72 mmHg     Pulse Rate 06/19/15 1100 85     Resp 06/19/15 1100 16     Temp 06/19/15 1100 98 F (36.7 C)     Temp Source 06/19/15 1100 Oral     SpO2 06/19/15 1100 98 %     Weight 06/19/15 1100 175 lb (79.379 kg)     Height 06/19/15 1100  (1.6 m)     Head Cir --      Peak Flow --      Pain Score 06/19/15 1102 5     Pain Loc --      Pain Edu? --      Excl. in GC? --     Constitutional: Alert and oriented. Well appearing and in no acute distress. Eyes: Conjunctivae are normal. PERRL. EOMI. Head: Atraumatic. Nose: No congestion/rhinnorhea. Mouth/Throat: Mucous membranes are moist.  Oropharynx non-erythematous. Neck: No stridor.   Cardiovascular: Normal rate, regular rhythm. Grossly normal heart sounds.  Good peripheral circulation. Respiratory: Normal respiratory effort.  No retractions. Lungs CTAB. Gastrointestinal: Soft and nontender. No distention. No abdominal bruits. No CVA tenderness. Musculoskeletal: No lower extremity tenderness nor edema.  No joint effusions. No visible deformity to left wrist when comparing with right. Mild diffuse tenderness to palpation over the extensor and flexor tendons. Positive Tinel's and Phalen's. Equal grip strength bilaterally. Cap refill is brisk. Neurologic:  Normal  speech and language. No gross focal neurologic deficits are appreciated. No gait instability. Skin:  Skin is warm, dry and intact. No rash noted. Psychiatric: Mood and affect are normal. Speech and behavior are normal.  ____________________________________________   LABS (all labs ordered are listed, but only abnormal results are displayed)  Labs Reviewed - No data to  display ____________________________________________  EKG   ____________________________________________  RADIOLOGY   ____________________________________________   PROCEDURES  Procedure(s) performed: None  Critical Care performed: No  ____________________________________________   INITIAL IMPRESSION / ASSESSMENT AND PLAN / ED COURSE  Pertinent labs & imaging results that were available during my care of the patient were reviewed by me and considered in my medical decision making (see chart for details).  Agents history, symptoms, physical exam are consistent with carpal tunnel syndrome left wrist. Advised patient of findings and diagnosis and she verbalizes understanding same. Patient will be given a wrist splint here in the emergency department and discharged home with meloxicam. Advised the patient to follow-up with Dr. Reita Chardhris Smith in 3 weeks if symptoms have not improved. Patient verbalizes understanding of same and verbalizes that she will follow-up with orthopedics should symptoms persist past treatment course. ____________________________________________   FINAL CLINICAL IMPRESSION(S) / ED DIAGNOSES  Final diagnoses:  Carpal tunnel syndrome of left wrist      Racheal PatchesJonathan D Cuthriell, PA-C 06/19/15 1251  Emily FilbertJonathan E Williams, MD 06/19/15 1323

## 2015-08-21 ENCOUNTER — Emergency Department
Admission: EM | Admit: 2015-08-21 | Discharge: 2015-08-21 | Disposition: A | Discharge status: Home / Self Care | Payer: BLUE CROSS/BLUE SHIELD | Attending: Emergency Medicine | Admitting: Emergency Medicine

## 2015-08-21 ENCOUNTER — Emergency Department: Payer: BLUE CROSS/BLUE SHIELD

## 2015-08-21 ENCOUNTER — Encounter: Payer: Self-pay | Admitting: *Deleted

## 2015-08-21 DIAGNOSIS — R1011 Right upper quadrant pain: Secondary | ICD-10-CM

## 2015-08-21 DIAGNOSIS — Z7982 Long term (current) use of aspirin: Secondary | ICD-10-CM | POA: Insufficient documentation

## 2015-08-21 DIAGNOSIS — R1013 Epigastric pain: Secondary | ICD-10-CM | POA: Diagnosis present

## 2015-08-21 DIAGNOSIS — K529 Noninfective gastroenteritis and colitis, unspecified: Secondary | ICD-10-CM | POA: Insufficient documentation

## 2015-08-21 DIAGNOSIS — I10 Essential (primary) hypertension: Secondary | ICD-10-CM | POA: Diagnosis not present

## 2015-08-21 DIAGNOSIS — F17200 Nicotine dependence, unspecified, uncomplicated: Secondary | ICD-10-CM | POA: Diagnosis not present

## 2015-08-21 DIAGNOSIS — Z79899 Other long term (current) drug therapy: Secondary | ICD-10-CM | POA: Diagnosis not present

## 2015-08-21 LAB — COMPREHENSIVE METABOLIC PANEL
ALT: 12 U/L — AB (ref 14–54)
AST: 19 U/L (ref 15–41)
Albumin: 4.3 g/dL (ref 3.5–5.0)
Alkaline Phosphatase: 77 U/L (ref 38–126)
Anion gap: 8 (ref 5–15)
BUN: 13 mg/dL (ref 6–20)
CHLORIDE: 104 mmol/L (ref 101–111)
CO2: 26 mmol/L (ref 22–32)
Calcium: 10 mg/dL (ref 8.9–10.3)
Creatinine, Ser: 0.59 mg/dL (ref 0.44–1.00)
Glucose, Bld: 94 mg/dL (ref 65–99)
POTASSIUM: 3.4 mmol/L — AB (ref 3.5–5.1)
SODIUM: 138 mmol/L (ref 135–145)
Total Bilirubin: 0.7 mg/dL (ref 0.3–1.2)
Total Protein: 8 g/dL (ref 6.5–8.1)

## 2015-08-21 LAB — CBC WITH DIFFERENTIAL/PLATELET
BASOS ABS: 0.1 10*3/uL (ref 0–0.1)
Basophils Relative: 1 %
EOS ABS: 0.2 10*3/uL (ref 0–0.7)
EOS PCT: 2 %
HCT: 38.9 % (ref 35.0–47.0)
Hemoglobin: 13.4 g/dL (ref 12.0–16.0)
LYMPHS ABS: 2.9 10*3/uL (ref 1.0–3.6)
LYMPHS PCT: 29 %
MCH: 30.1 pg (ref 26.0–34.0)
MCHC: 34.5 g/dL (ref 32.0–36.0)
MCV: 87.4 fL (ref 80.0–100.0)
MONO ABS: 0.9 10*3/uL (ref 0.2–0.9)
Monocytes Relative: 9 %
Neutro Abs: 6.1 10*3/uL (ref 1.4–6.5)
Neutrophils Relative %: 59 %
PLATELETS: 325 10*3/uL (ref 150–440)
RBC: 4.45 MIL/uL (ref 3.80–5.20)
RDW: 13.9 % (ref 11.5–14.5)
WBC: 10.2 10*3/uL (ref 3.6–11.0)

## 2015-08-21 LAB — LIPASE, BLOOD: LIPASE: 18 U/L (ref 11–51)

## 2015-08-21 MED ORDER — DICYCLOMINE HCL 20 MG PO TABS: 20.0000 mg | ORAL_TABLET | Freq: Three times a day (TID) | ORAL | Status: DC | PRN

## 2015-08-21 MED ORDER — KETOROLAC TROMETHAMINE 30 MG/ML IJ SOLN
15.0000 mg | Freq: Once | INTRAMUSCULAR | Status: AC
  Administered 2015-08-21: 15 mg via INTRAVENOUS
  Filled 2015-08-21: qty 1

## 2015-08-21 NOTE — Discharge Instructions (Signed)
Abdominal Pain, Adult °Many things can cause abdominal pain. Usually, abdominal pain is not caused by a disease and will improve without treatment. It can often be observed and treated at home. Your health care provider will do a physical exam and possibly order blood tests and X-rays to help determine the seriousness of your pain. However, in many cases, more time must pass before a clear cause of the pain can be found. Before that point, your health care provider may not know if you need more testing or further treatment. °HOME CARE INSTRUCTIONS °Monitor your abdominal pain for any changes. The following actions may help to alleviate any discomfort you are experiencing: °· Only take over-the-counter or prescription medicines as directed by your health care provider. °· Do not take laxatives unless directed to do so by your health care provider. °· Try a clear liquid diet (broth, tea, or water) as directed by your health care provider. Slowly move to a bland diet as tolerated. °SEEK MEDICAL CARE IF: °· You have unexplained abdominal pain. °· You have abdominal pain associated with nausea or diarrhea. °· You have pain when you urinate or have a bowel movement. °· You experience abdominal pain that wakes you in the night. °· You have abdominal pain that is worsened or improved by eating food. °· You have abdominal pain that is worsened with eating fatty foods. °· You have a fever. °SEEK IMMEDIATE MEDICAL CARE IF: °· Your pain does not go away within 2 hours. °· You keep throwing up (vomiting). °· Your pain is felt only in portions of the abdomen, such as the right side or the left lower portion of the abdomen. °· You pass bloody or black tarry stools. °MAKE SURE YOU: °· Understand these instructions. °· Will watch your condition. °· Will get help right away if you are not doing well or get worse. °  °This information is not intended to replace advice given to you by your health care provider. Make sure you discuss  any questions you have with your health care provider. °  °Document Released: 04/13/2005 Document Revised: 03/25/2015 Document Reviewed: 03/13/2013 °Elsevier Interactive Patient Education ©2016 [employer] ° ° °Please return immediately if condition worsens. Please contact her primary physician or the physician you were given for referral. If you have any specialist physicians involved in her treatment and plan please also contact them. Thank you for using [facility]. ° °

## 2015-08-21 NOTE — ED Provider Notes (Signed)
Time Seen: Approximately [phone] I have reviewed the triage notes  Chief Complaint: Abdominal Pain   History of Present Illness: Carla [name] is a 55 y.o. female *who states that she's noticed some diffuse abdominal cramping which started yesterday. She states it started more toward the evening 1 hour after eating a pork chop. States the child was adequately closed. He denies any fever at home and she states most of her discomfort points to the epigastric and left upper quadrant. She denies any fever at home. She denies any nausea or vomiting. She states she has had some loose watery stool without any melena or hematochezia. She denies any recent travel or antibiotic therapy.   Past Medical History  Diagnosis Date  . Hypertension   . Myocardial infarct, old     There are no active problems to display for this patient.   Past Surgical History  Procedure Laterality Date  . Carpal tunnel release    . Abdominal hysterectomy      Past Surgical History  Procedure Laterality Date  . Carpal tunnel release    . Abdominal hysterectomy      Current Outpatient Rx  Name  Route  Sig  Dispense  Refill  . amLODipine (NORVASC) 5 MG tablet   Oral   Take 1 tablet by mouth daily.         Marland Kitchen aspirin EC 81 MG tablet   Oral   Take 1 tablet by mouth daily.         . hydrochlorothiazide (HYDRODIURIL) 12.5 MG tablet   Oral   Take 1 tablet by mouth daily.         Marland Kitchen lisinopril (PRINIVIL,ZESTRIL) 30 MG tablet   Oral   Take 1 tablet by mouth daily.         . metoprolol (LOPRESSOR) 50 MG tablet   Oral   Take 50 mg by mouth daily.         . pantoprazole (PROTONIX) 40 MG tablet   Oral   Take 1 tablet by mouth daily.         Marland Kitchen dicyclomine (BENTYL) 20 MG tablet   Oral   Take 1 tablet (20 mg total) by mouth 3 (three) times daily as needed for spasms.   30 tablet   0   . meloxicam (MOBIC) 15 MG tablet   Oral   Take 1 tablet (15 mg total) by mouth daily. Patient not taking:  Reported on 08/21/2015   30 tablet   0     Allergies:  Review of patient's allergies indicates no known allergies.  Family History: History reviewed. No pertinent family history.  Social History: Social History  Substance Use Topics  . Smoking status: Current Every Day Smoker  . Smokeless tobacco: None  . Alcohol Use: None     Review of Systems:   10 point review of systems was performed and was otherwise negative:  Constitutional: No fever Eyes: No visual disturbances ENT: No sore throat, ear pain Cardiac: No chest pain Respiratory: No shortness of breath, wheezing, or stridor Abdomen: Abdominal pain as crampy and she points mainly to this historian toward her right upper quadrant region. She denies much in way of lower abdominal pain. She denies any back or flank pain Endocrine: No weight loss, No night sweats Extremities: No peripheral edema, cyanosis Skin: No rashes, easy bruising Neurologic: No focal weakness, trouble with speech or swollowing Urologic: No dysuria, Hematuria, or urinary frequency   Physical Exam:  ED  Triage Vitals  Enc Vitals Group     BP 08/21/15 0922 141/78 mmHg     Pulse Rate 08/21/15 0922 74     Resp 08/21/15 0922 18     Temp 08/21/15 0922 98.4 F (36.9 C)     Temp Source 08/21/15 0922 Oral     SpO2 08/21/15 0922 98 %     Weight 08/21/15 0922 178 lb (80.74 kg)     Height 08/21/15 0922  (1.6 m)     Head Cir --      Peak Flow --      Pain Score 08/21/15 0922 5     Pain Loc --      Pain Edu? --      Excl. in GC? --     General: Awake , Alert , and Oriented times 3; GCS 15 Head: Normal cephalic , atraumatic Eyes: Pupils equal , round, reactive to light Nose/Throat: No nasal drainage, patent upper airway without erythema or exudate.  Neck: Supple, Full range of motion, No anterior adenopathy or palpable thyroid masses Lungs: Clear to ascultation without wheezes , rhonchi, or rales Heart: Regular rate, regular rhythm without  murmurs , gallops , or rubs Abdomen: Abdomen is soft with some mild tenderness toward the right upper quadrant without any rebound, guarding, or rigidity. No palpable masses. No focal tenderness over McBurney's point. Negative psoas, negative obturator sign. Past eyes are positive and symmetric in all 4 quadrants.        Extremities: 2 plus symmetric pulses. No edema, clubbing or cyanosis Neurologic: normal ambulation, Motor symmetric without deficits, sensory intact Skin: warm, dry, no rashes   Labs:   All laboratory work was reviewed including any pertinent negatives or positives listed below:  Labs Reviewed  COMPREHENSIVE METABOLIC PANEL - Abnormal; Notable for the following:    Potassium 3.4 (*)    ALT 12 (*)    All other components within normal limits  CBC WITH DIFFERENTIAL/PLATELET  LIPASE, BLOOD   reviewed the patient's laboratory work shows no significant findings   Radiology:    EXAM: US ABDOMEN LIMITED - RIGHT UPPER QUADRANT  COMPARISON: None.  FINDINGS: Gallbladder:  No gallstones or wall thickening visualized. No sonographic Murphy sign noted by sonographer.  Common bile duct:  Diameter: 5.2 mm  Liver:  No focal lesion identified. Within normal limits in parenchymal echogenicity.  IMPRESSION: Normal right upper quadrant ultrasound.   I personally reviewed the radiologic studies    ED Course:  Differential diagnosis includes but is not exclusive to acute cholecystitis, intrathoracic causes for epigastric abdominal pain, gastritis, duodenitis, pancreatitis, small bowel or large bowel obstruction, abdominal aortic aneurysm, hernia, gastritis, etc. Given the patient's current clinical presentation and objective findings I felt this may be viral gastritis or irritable bowel syndrome. I felt was unlikely to be surgical at this point. Patient's ultrasound does not show any findings over the gallbladder region and she was symptomatically improved with  Toradol here in emergency department some IV fluids. I did caution her that if her pain increases and becomes more consistent especially over the right lower quadrant that she should return here to the hospital for further assessment. Patient appears to be of understanding she was prescribed Bentyl and an outpatient basis and advised drink plenty of fluids.    Assessment:  Gastroenteritis   Final Clinical Impression: *  Final diagnoses:  Right upper quadrant abdominal pain     Plan:  Outpatient management Patient was advised especially return if  she develops fever, bloody stool, increased pain in the lower abdominal region or any other new concerns            Jennye Moccasin, MD 08/21/15 [phone]

## 2015-08-21 NOTE — ED Notes (Signed)
Pt states lower abd cramping that began last night, states some diaherra, denies any nausea or vomiting, states she ate a pork chop and then began to feel sick, denies any vaginal bleeding or discharge

## 2015-09-08 ENCOUNTER — Emergency Department
Admission: EM | Admit: 2015-09-08 | Discharge: 2015-09-08 | Disposition: A | Discharge status: Home / Self Care | Payer: BLUE CROSS/BLUE SHIELD | Attending: Emergency Medicine | Admitting: Emergency Medicine

## 2015-09-08 ENCOUNTER — Encounter: Payer: Self-pay | Admitting: Emergency Medicine

## 2015-09-08 DIAGNOSIS — F17200 Nicotine dependence, unspecified, uncomplicated: Secondary | ICD-10-CM | POA: Insufficient documentation

## 2015-09-08 DIAGNOSIS — J069 Acute upper respiratory infection, unspecified: Secondary | ICD-10-CM | POA: Diagnosis not present

## 2015-09-08 DIAGNOSIS — I10 Essential (primary) hypertension: Secondary | ICD-10-CM | POA: Insufficient documentation

## 2015-09-08 DIAGNOSIS — Z7982 Long term (current) use of aspirin: Secondary | ICD-10-CM | POA: Diagnosis not present

## 2015-09-08 DIAGNOSIS — R509 Fever, unspecified: Secondary | ICD-10-CM | POA: Diagnosis present

## 2015-09-08 DIAGNOSIS — Z79899 Other long term (current) drug therapy: Secondary | ICD-10-CM | POA: Diagnosis not present

## 2015-09-08 MED ORDER — HYDROCOD POLST-CPM POLST ER 10-8 MG/5ML PO SUER: 5.0000 mL | Freq: Two times a day (BID) | ORAL | Status: DC

## 2015-09-08 MED ORDER — IBUPROFEN 50 MG PO CHEW: 50.0000 mg | CHEWABLE_TABLET | Freq: Three times a day (TID) | ORAL | Status: AC | PRN

## 2015-09-08 MED ORDER — AZITHROMYCIN 500 MG PO TABS: 500.0000 mg | ORAL_TABLET | Freq: Every day | ORAL | Status: DC

## 2015-09-08 MED ORDER — KETOROLAC TROMETHAMINE 60 MG/2ML IM SOLN
60.0000 mg | Freq: Once | INTRAMUSCULAR | Status: AC
  Administered 2015-09-08: 60 mg via INTRAMUSCULAR
  Filled 2015-09-08: qty 2

## 2015-09-08 MED ORDER — HYDROCOD POLST-CPM POLST ER 10-8 MG/5ML PO SUER
5.0000 mL | Freq: Once | ORAL | Status: AC
  Administered 2015-09-08: 5 mL via ORAL
  Filled 2015-09-08: qty 5

## 2015-09-08 NOTE — ED Provider Notes (Signed)
[facility] Emergency Department Provider Note  ____________________________________________  Time seen: Approximately 10:05 AM  I have reviewed the triage vital signs and the nursing notes.   HISTORY  Chief Complaint Fever    HPI Carla [name] is a 55 y.o. female patient complaining of sore throat nonproductive cough and body aches since yesterday. Patient denies nausea vomiting diarrhea. No palliative measures taken for this complaint. Patient rates her pain discomfort as 8/10.Patient state no immunization for this season.   Past Medical History  Diagnosis Date  . Hypertension   . Myocardial infarct, old     There are no active problems to display for this patient.   Past Surgical History  Procedure Laterality Date  . Carpal tunnel release    . Abdominal hysterectomy      Current Outpatient Rx  Name  Route  Sig  Dispense  Refill  . amLODipine (NORVASC) 5 MG tablet   Oral   Take 1 tablet by mouth daily.         Marland Kitchen aspirin EC 81 MG tablet   Oral   Take 1 tablet by mouth daily.         Marland Kitchen azithromycin (ZITHROMAX) 500 MG tablet   Oral   Take 1 tablet (500 mg total) by mouth daily. Take 1 tablet daily for 3 days.   3 tablet   0   . chlorpheniramine-HYDROcodone (TUSSIONEX PENNKINETIC ER) 10-8 MG/5ML SUER   Oral   Take 5 mLs by mouth 2 (two) times daily.   115 mL   0   . dicyclomine (BENTYL) 20 MG tablet   Oral   Take 1 tablet (20 mg total) by mouth 3 (three) times daily as needed for spasms.   30 tablet   0   . hydrochlorothiazide (HYDRODIURIL) 12.5 MG tablet   Oral   Take 1 tablet by mouth daily.         Marland Kitchen ibuprofen (ADVIL,MOTRIN) 50 MG chewable tablet   Oral   Chew 1 tablet (50 mg total) by mouth every 8 (eight) hours as needed for fever.   24 tablet   2   . lisinopril (PRINIVIL,ZESTRIL) 30 MG tablet   Oral   Take 1 tablet by mouth daily.         . meloxicam (MOBIC) 15 MG tablet   Oral   Take 1 tablet (15 mg  total) by mouth daily. Patient not taking: Reported on 08/21/2015   30 tablet   0   . metoprolol (LOPRESSOR) 50 MG tablet   Oral   Take 50 mg by mouth daily.         . pantoprazole (PROTONIX) 40 MG tablet   Oral   Take 1 tablet by mouth daily.           Allergies Review of patient's allergies indicates no known allergies.  No family history on file.  Social History Social History  Substance Use Topics  . Smoking status: Current Every Day Smoker  . Smokeless tobacco: None  . Alcohol Use: None    Review of Systems Constitutional: Fever and chills  Eyes: No visual changes. ENT: Sore throat Cardiovascular: Denies chest pain. Respiratory: Denies shortness of breath. Nonproductive cough Gastrointestinal: No abdominal pain.  No nausea, no vomiting.  No diarrhea.  No constipation. Genitourinary: Negative for dysuria. Musculoskeletal: Negative for back pain. Skin: Negative for rash. Neurological: Negative for headaches, focal weakness or numbness. 10-point ROS otherwise negative.  ____________________________________________   PHYSICAL EXAM:  VITAL SIGNS: ED Triage Vitals  Enc Vitals Group     BP 09/08/15 0904 107/60 mmHg     Pulse Rate 09/08/15 0904 87     Resp 09/08/15 0904 20     Temp 09/08/15 0904 99.2 F (37.3 C)     Temp Source 09/08/15 0904 Oral     SpO2 09/08/15 0904 95 %     Weight 09/08/15 0904 178 lb (80.74 kg)     Height 09/08/15 0904  (1.6 m)     Head Cir --      Peak Flow --      Pain Score 09/08/15 0919 8     Pain Loc --      Pain Edu? --      Excl. in GC? --     Constitutional: Alert and oriented. Well appearing and in no acute distress. Eyes: Conjunctivae are normal. PERRL. EOMI. Head: Atraumatic. Nose: No congestion/rhinnorhea. Mouth/Throat: Mucous membranes are moist.  Oropharynx non-erythematous. Neck: No stridor.  No cervical spine tenderness to palpation. Hematological/Lymphatic/Immunilogical: No cervical  lymphadenopathy. Cardiovascular: Normal rate, regular rhythm. Grossly normal heart sounds.  Good peripheral circulation. Respiratory: Normal respiratory effort.  No retractions. Lungs CTAB. Gastrointestinal: Soft and nontender. No distention. No abdominal bruits. No CVA tenderness. Musculoskeletal: No lower extremity tenderness nor edema.  No joint effusions. Neurologic:  Normal speech and language. No gross focal neurologic deficits are appreciated. No gait instability. Skin:  Skin is warm, dry and intact. No rash noted. Psychiatric: Mood and affect are normal. Speech and behavior are normal.  ____________________________________________   LABS (all labs ordered are listed, but only abnormal results are displayed)  Labs Reviewed - No data to display ____________________________________________  EKG   ____________________________________________  RADIOLOGY   ____________________________________________   PROCEDURES  Procedure(s) performed: None  Critical Care performed: No  ____________________________________________   INITIAL IMPRESSION / ASSESSMENT AND PLAN / ED COURSE  Pertinent labs & imaging results that were available during my care of the patient were reviewed by me and considered in my medical decision making (see chart for details).  Viral upper respiratory infection. Patient given discharge Instructions. Patient given prescription for ibuprofen and Tussionex. Patient given a work note for 2 days. ____________________________________________   FINAL CLINICAL IMPRESSION(S) / ED DIAGNOSES  Final diagnoses:  URI (upper respiratory infection)      Joni Reining, PA-C 09/08/15 1011  Richardean Canal, MD 09/08/15 [phone]

## 2015-09-08 NOTE — Discharge Instructions (Signed)
Upper Respiratory Infection, Adult Most upper respiratory infections (URIs) are a viral infection of the air passages leading to the lungs. A URI affects the nose, throat, and upper air passages. The most common type of URI is nasopharyngitis and is typically referred to as "the common cold." URIs run their course and usually go away on their own. Most of the time, a URI does not require medical attention, but sometimes a bacterial infection in the upper airways can follow a viral infection. This is called a secondary infection. Sinus and middle ear infections are common types of secondary upper respiratory infections. Bacterial pneumonia can also complicate a URI. A URI can worsen asthma and chronic obstructive pulmonary disease (COPD). Sometimes, these complications can require emergency medical care and may be life threatening.  CAUSES Almost all URIs are caused by viruses. A virus is a type of germ and can spread from one person to another.  RISKS FACTORS You may be at risk for a URI if:   You smoke.   You have chronic heart or lung disease.  You have a weakened defense (immune) system.   You are very young or very old.   You have nasal allergies or asthma.  You work in crowded or poorly ventilated areas.  You work in health care facilities or schools. SIGNS AND SYMPTOMS  Symptoms typically develop 2-3 days after you come in contact with a cold virus. Most viral URIs last 7-10 days. However, viral URIs from the influenza virus (flu virus) can last 14-18 days and are typically more severe. Symptoms may include:   Runny or stuffy (congested) nose.   Sneezing.   Cough.   Sore throat.   Headache.   Fatigue.   Fever.   Loss of appetite.   Pain in your forehead, behind your eyes, and over your cheekbones (sinus pain).  Muscle aches.  DIAGNOSIS  Your health care provider may diagnose a URI by:  Physical exam.  Tests to check that your symptoms are not due to  another condition such as:  Strep throat.  Sinusitis.  Pneumonia.  Asthma. TREATMENT  A URI goes away on its own with time. It cannot be cured with medicines, but medicines may be prescribed or recommended to relieve symptoms. Medicines may help:  Reduce your fever.  Reduce your cough.  Relieve nasal congestion. HOME CARE INSTRUCTIONS   Take medicines only as directed by your health care provider.   Gargle warm saltwater or take cough drops to comfort your throat as directed by your health care provider.  Use a warm mist humidifier or inhale steam from a shower to increase air moisture. This may make it easier to breathe.  Drink enough fluid to keep your urine clear or pale yellow.   Eat soups and other clear broths and maintain good nutrition.   Rest as needed.   Return to work when your temperature has returned to normal or as your health care provider advises. You may need to stay home longer to avoid infecting others. You can also use a face mask and careful hand washing to prevent spread of the virus.  Increase the usage of your inhaler if you have asthma.   Do not use any tobacco products, including cigarettes, chewing tobacco, or electronic cigarettes. If you need help quitting, ask your health care provider. PREVENTION  The best way to protect yourself from getting a cold is to practice good hygiene.   Avoid oral or hand contact with people with cold   symptoms.   Wash your hands often if contact occurs.  There is no clear evidence that vitamin C, vitamin E, echinacea, or exercise reduces the chance of developing a cold. However, it is always recommended to get plenty of rest, exercise, and practice good nutrition.  SEEK MEDICAL CARE IF:   You are getting worse rather than better.   Your symptoms are not controlled by medicine.   You have chills.  You have worsening shortness of breath.  You have brown or red mucus.  You have yellow or brown nasal  discharge.  You have pain in your face, especially when you bend forward.  You have a fever.  You have swollen neck glands.  You have pain while swallowing.  You have white areas in the back of your throat. SEEK IMMEDIATE MEDICAL CARE IF:   You have severe or persistent:  Headache.  Ear pain.  Sinus pain.  Chest pain.  You have chronic lung disease and any of the following:  Wheezing.  Prolonged cough.  Coughing up blood.  A change in your usual mucus.  You have a stiff neck.  You have changes in your:  Vision.  Hearing.  Thinking.  Mood. MAKE SURE YOU:   Understand these instructions.  Will watch your condition.  Will get help right away if you are not doing well or get worse.   This information is not intended to replace advice given to you by your health care provider. Make sure you discuss any questions you have with your health care provider.   Document Released: 12/28/2000 Document Revised: 11/18/2014 Document Reviewed: 10/09/2013 Elsevier Interactive Patient Education 2016 [employer] Inc[employer]  

## 2015-09-08 NOTE — ED Notes (Signed)
States she developed body aches with sore throat since yesterday afternoon  Also has had intermittent rash for about 2 weeks.Marland Kitchen

## 2015-09-08 NOTE — ED Notes (Signed)
States she developed cough body aches and fever since yesterday

## 2015-09-10 ENCOUNTER — Emergency Department
Admission: EM | Admit: 2015-09-10 | Discharge: 2015-09-10 | Disposition: A | Discharge status: Home / Self Care | Payer: BLUE CROSS/BLUE SHIELD | Attending: Emergency Medicine | Admitting: Emergency Medicine

## 2015-09-10 ENCOUNTER — Encounter: Payer: Self-pay | Admitting: Emergency Medicine

## 2015-09-10 DIAGNOSIS — F17210 Nicotine dependence, cigarettes, uncomplicated: Secondary | ICD-10-CM | POA: Diagnosis not present

## 2015-09-10 DIAGNOSIS — Z79899 Other long term (current) drug therapy: Secondary | ICD-10-CM | POA: Diagnosis not present

## 2015-09-10 DIAGNOSIS — J069 Acute upper respiratory infection, unspecified: Secondary | ICD-10-CM | POA: Diagnosis not present

## 2015-09-10 DIAGNOSIS — R509 Fever, unspecified: Secondary | ICD-10-CM | POA: Diagnosis present

## 2015-09-10 DIAGNOSIS — Z7982 Long term (current) use of aspirin: Secondary | ICD-10-CM | POA: Diagnosis not present

## 2015-09-10 DIAGNOSIS — I10 Essential (primary) hypertension: Secondary | ICD-10-CM | POA: Diagnosis not present

## 2015-09-10 DIAGNOSIS — J209 Acute bronchitis, unspecified: Secondary | ICD-10-CM | POA: Diagnosis not present

## 2015-09-10 HISTORY — DX: Atherosclerotic heart disease of native coronary artery without angina pectoris: I25.10

## 2015-09-10 MED ORDER — BENZONATATE 100 MG PO CAPS: 100.0000 mg | ORAL_CAPSULE | Freq: Three times a day (TID) | ORAL | Status: DC | PRN

## 2015-09-10 NOTE — Discharge Instructions (Signed)
Acute Bronchitis Bronchitis is inflammation of the airways that extend from the windpipe into the lungs (bronchi). The inflammation often causes mucus to develop. This leads to a cough, which is the most common symptom of bronchitis.  In acute bronchitis, the condition usually develops suddenly and goes away over time, usually in a couple weeks. Smoking, allergies, and asthma can make bronchitis worse. Repeated episodes of bronchitis may cause further lung problems.  CAUSES Acute bronchitis is most often caused by the same virus that causes a cold. The virus can spread from person to person (contagious) through coughing, sneezing, and touching contaminated objects. SIGNS AND SYMPTOMS   Cough.   Fever.   Coughing up mucus.   Body aches.   Chest congestion.   Chills.   Shortness of breath.   Sore throat.  DIAGNOSIS  Acute bronchitis is usually diagnosed through a physical exam. Your health care provider will also ask you questions about your medical history. Tests, such as chest X-rays, are sometimes done to rule out other conditions.  TREATMENT  Acute bronchitis usually goes away in a couple weeks. Oftentimes, no medical treatment is necessary. Medicines are sometimes given for relief of fever or cough. Antibiotic medicines are usually not needed but may be prescribed in certain situations. In some cases, an inhaler may be recommended to help reduce shortness of breath and control the cough. A cool mist vaporizer may also be used to help thin bronchial secretions and make it easier to clear the chest.  HOME CARE INSTRUCTIONS  Get plenty of rest.   Drink enough fluids to keep your urine clear or pale yellow (unless you have a medical condition that requires fluid restriction). Increasing fluids may help thin your respiratory secretions (sputum) and reduce chest congestion, and it will prevent dehydration.   Take medicines only as directed by your health care provider.  If  you were prescribed an antibiotic medicine, finish it all even if you start to feel better.  Avoid smoking and secondhand smoke. Exposure to cigarette smoke or irritating chemicals will make bronchitis worse. If you are a smoker, consider using nicotine gum or skin patches to help control withdrawal symptoms. Quitting smoking will help your lungs heal faster.   Reduce the chances of another bout of acute bronchitis by washing your hands frequently, avoiding people with cold symptoms, and trying not to touch your hands to your mouth, nose, or eyes.   Keep all follow-up visits as directed by your health care provider.  SEEK MEDICAL CARE IF: Your symptoms do not improve after 1 week of treatment.  SEEK IMMEDIATE MEDICAL CARE IF:  You develop an increased fever or chills.   You have chest pain.   You have severe shortness of breath.  You have bloody sputum.   You develop dehydration.  You faint or repeatedly feel like you are going to pass out.  You develop repeated vomiting.  You develop a severe headache. MAKE SURE YOU:   Understand these instructions.  Will watch your condition.  Will get help right away if you are not doing well or get worse.   This information is not intended to replace advice given to you by your health care provider. Make sure you discuss any questions you have with your health care provider.   Document Released: 08/11/2004 Document Revised: 07/25/2014 Document Reviewed: 12/25/2012 Elsevier Interactive Patient Education 2016 [employer].  [employer] Vaporizers Vaporizers may help relieve the symptoms of a cough and cold. They add moisture to  the air, which helps mucus to become thinner and less sticky. This makes it easier to breathe and cough up secretions. Cool mist vaporizers do not cause serious burns like hot mist vaporizers, which may also be called steamers or humidifiers. Vaporizers have not been proven to help with colds. You should not use  a vaporizer if you are allergic to mold. HOME CARE INSTRUCTIONS  Follow the package instructions for the vaporizer.  Do not use anything other than distilled water in the vaporizer.  Do not run the vaporizer all of the time. This can cause mold or bacteria to grow in the vaporizer.  Clean the vaporizer after each time it is used.  Clean and dry the vaporizer well before storing it.  Stop using the vaporizer if worsening respiratory symptoms develop.   This information is not intended to replace advice given to you by your health care provider. Make sure you discuss any questions you have with your health care provider.   Document Released: 03/31/2004 Document Revised: 07/09/2013 Document Reviewed: 11/21/2012 Elsevier Interactive Patient Education 2016 Elsevier Inc.  Upper Respiratory Infection, Adult Most upper respiratory infections (URIs) are caused by a virus. A URI affects the nose, throat, and upper air passages. The most common type of URI is often called "the common cold." HOME CARE   Take medicines only as told by your doctor.  Gargle warm saltwater or take cough drops to comfort your throat as told by your doctor.  Use a warm mist humidifier or inhale steam from a shower to increase air moisture. This may make it easier to breathe.  Drink enough fluid to keep your pee (urine) clear or pale yellow.  Eat soups and other clear broths.  Have a healthy diet.  Rest as needed.  Go back to work when your fever is gone or your doctor says it is okay.  You may need to stay home longer to avoid giving your URI to others.  You can also wear a face mask and wash your hands often to prevent spread of the virus.  Use your inhaler more if you have asthma.  Do not use any tobacco products, including cigarettes, chewing tobacco, or electronic cigarettes. If you need help quitting, ask your doctor. GET HELP IF:  You are getting worse, not better.  Your symptoms are not  helped by medicine.  You have chills.  You are getting more short of breath.  You have brown or red mucus.  You have yellow or brown discharge from your nose.  You have pain in your face, especially when you bend forward.  You have a fever.  You have puffy (swollen) neck glands.  You have pain while swallowing.  You have white areas in the back of your throat. GET HELP RIGHT AWAY IF:   You have very bad or constant:  Headache.  Ear pain.  Pain in your forehead, behind your eyes, and over your cheekbones (sinus pain).  Chest pain.  You have long-lasting (chronic) lung disease and any of the following:  Wheezing.  Long-lasting cough.  Coughing up blood.  A change in your usual mucus.  You have a stiff neck.  You have changes in your:  Vision.  Hearing.  Thinking.  Mood. MAKE SURE YOU:   Understand these instructions.  Will watch your condition.  Will get help right away if you are not doing well or get worse.   This information is not intended to replace advice given to you by  your health care provider. Make sure you discuss any questions you have with your health care provider.   Document Released: 12/21/2007 Document Revised: 11/18/2014 Document Reviewed: 10/09/2013 Elsevier Interactive Patient Education 2016 [employer].   Continue to take the previously prescribed antibiotic. Start a daily allergy medicine as well as Delsym cough syrup. Dose the Tessalon Perles as needed for cough control. Increase fluid intake to prevent dehydration.

## 2015-09-10 NOTE — ED Notes (Addendum)
Pt with productive cough. Clear lung sounds. A/o, vss. No n/v since here tuesday. Denies diarrhea.

## 2015-09-10 NOTE — ED Notes (Signed)
Patient presents to the ED with fever and body aches since Sunday.  Patient reports loose bowels but none in the past 24 hours.  Patient reports throat pain when she coughs.  Patient states cough is productive but patient states she doesn't look at her sputum.

## 2015-09-10 NOTE — ED Provider Notes (Signed)
[facility] Emergency Department Provider Note ____________________________________________  Time seen: 1335  I have reviewed the triage vital signs and the nursing notes.  HISTORY  Chief Complaint  Generalized Body Aches and Fever  HPI Carla [name] is a 55 y.o. female persisted ED for evaluation of continued symptoms including productive cough despite treatment with azithromycin, and codeine cough syrup.She denies intermittent fevers, chills, or sweats. She describes only sore throat pain related to her cough. She denies any chest pain, shortness of breath, or wheezing. She is otherwise tolerating medications well and does admit to some increased fatigue and mild dizziness when she changes position. She does also endorse that she has not push fluids and this patient managed to allow her to use the bathroom with a regular frequency.  Past Medical History  Diagnosis Date  . Hypertension   . Myocardial infarct, old   . Coronary artery disease     There are no active problems to display for this patient.   Past Surgical History  Procedure Laterality Date  . Carpal tunnel release    . Abdominal hysterectomy      Current Outpatient Rx  Name  Route  Sig  Dispense  Refill  . amLODipine (NORVASC) 5 MG tablet   Oral   Take 1 tablet by mouth daily.         Marland Kitchen aspirin EC 81 MG tablet   Oral   Take 1 tablet by mouth daily.         Marland Kitchen azithromycin (ZITHROMAX) 500 MG tablet   Oral   Take 1 tablet (500 mg total) by mouth daily. Take 1 tablet daily for 3 days.   3 tablet   0   . benzonatate (TESSALON PERLES) 100 MG capsule   Oral   Take 1 capsule (100 mg total) by mouth 3 (three) times daily as needed for cough (Take 1-2 per dose).   30 capsule   0   . chlorpheniramine-HYDROcodone (TUSSIONEX PENNKINETIC ER) 10-8 MG/5ML SUER   Oral   Take 5 mLs by mouth 2 (two) times daily.   115 mL   0   . dicyclomine (BENTYL) 20 MG tablet   Oral   Take 1  tablet (20 mg total) by mouth 3 (three) times daily as needed for spasms.   30 tablet   0   . hydrochlorothiazide (HYDRODIURIL) 12.5 MG tablet   Oral   Take 1 tablet by mouth daily.         Marland Kitchen ibuprofen (ADVIL,MOTRIN) 50 MG chewable tablet   Oral   Chew 1 tablet (50 mg total) by mouth every 8 (eight) hours as needed for fever.   24 tablet   2   . lisinopril (PRINIVIL,ZESTRIL) 30 MG tablet   Oral   Take 1 tablet by mouth daily.         . meloxicam (MOBIC) 15 MG tablet   Oral   Take 1 tablet (15 mg total) by mouth daily. Patient not taking: Reported on 08/21/2015   30 tablet   0   . metoprolol (LOPRESSOR) 50 MG tablet   Oral   Take 50 mg by mouth daily.         . pantoprazole (PROTONIX) 40 MG tablet   Oral   Take 1 tablet by mouth daily.          Allergies Review of patient's allergies indicates no known allergies.  No family history on file.  Social History Social History  Substance  Use Topics  . Smoking status: Current Every Day Smoker -- 0.50 packs/day    Types: Cigarettes  . Smokeless tobacco: None  . Alcohol Use: None   Review of Systems  Constitutional: Negative for fever. Eyes: Negative for visual changes. ENT: Negative for sore throat. Cardiovascular: Negative for chest pain. Respiratory: Negative for shortness of breath. Reports productive cough as above. Gastrointestinal: Negative for abdominal pain, vomiting and diarrhea. Genitourinary: Negative for dysuria. Musculoskeletal: Negative for back pain. Skin: Negative for rash. Neurological: Negative for headaches, focal weakness or numbness. ____________________________________________  PHYSICAL EXAM:  VITAL SIGNS: ED Triage Vitals  Enc Vitals Group     BP 09/10/15 1212 95/49 mmHg     Pulse Rate 09/10/15 1212 72     Resp 09/10/15 1212 18     Temp 09/10/15 1212 98.4 F (36.9 C)     Temp Source 09/10/15 1212 Oral     SpO2 09/10/15 1212 100 %     Weight 09/10/15 1212 175 lb (79.379 kg)      Height 09/10/15 1212  (1.6 m)     Head Cir --      Peak Flow --      Pain Score 09/10/15 1213 7     Pain Loc --      Pain Edu? --      Excl. in GC? --    Constitutional: Alert and oriented. Well appearing and in no distress. Head: Normocephalic and atraumatic.      Eyes: Conjunctivae are normal. PERRL. Normal extraocular movements      Ears: Canals clear. TMs intact bilaterally.   Nose: No congestion/rhinorrhea.   Mouth/Throat: Mucous membranes are moist. Uvula midline. Tonsils flat without erythema, edema, or exudate. No oral lesion.    Neck: Supple. No thyromegaly. Hematological/Lymphatic/Immunological: No cervical lymphadenopathy. Cardiovascular: Normal rate, regular rhythm.  Respiratory: Normal respiratory effort. No wheezes/rales/rhonchi. Musculoskeletal: Nontender with normal range of motion in all extremities.  Neurologic:  Normal gait without ataxia. Normal speech and language. No gross focal neurologic deficits are appreciated. Skin:  Skin is warm, dry and intact. No rash noted. Psychiatric: Mood and affect are normal. Patient exhibits appropriate insight and judgment. ___________________________________________  INITIAL IMPRESSION / ASSESSMENT AND PLAN / ED COURSE  Patient with symptoms consistent with an acute bronchitis with mild bronchospasm as well as upper respiratory symptoms. She is on the correct regimen including azithromycin previously prescribed. She is encouraged to complete that course and considered dosing over-the-counter Delsym for cough management. Patient is encouraged to increase her fluid intake to prevent dehydration as evidenced by her infrequent bathroom brakes. She will follow with her primary provider for ongoing symptom management. Return to the ED for acutely worsening symptoms. Work note is provided for 3 days as requested. ____________________________________________  FINAL CLINICAL IMPRESSION(S) / ED DIAGNOSES  Final  diagnoses:  Acute URI  Bronchitis, acute, with bronchospasm      Lissa Hoard, PA-C 09/10/15 1404  Governor Rooks, MD 09/10/15 1539

## 2015-09-21 ENCOUNTER — Emergency Department
Admission: EM | Admit: 2015-09-21 | Discharge: 2015-09-21 | Disposition: A | Discharge status: Home / Self Care | Payer: BLUE CROSS/BLUE SHIELD | Attending: Emergency Medicine | Admitting: Emergency Medicine

## 2015-09-21 DIAGNOSIS — F17210 Nicotine dependence, cigarettes, uncomplicated: Secondary | ICD-10-CM | POA: Diagnosis not present

## 2015-09-21 DIAGNOSIS — Z79899 Other long term (current) drug therapy: Secondary | ICD-10-CM | POA: Insufficient documentation

## 2015-09-21 DIAGNOSIS — W57XXXA Bitten or stung by nonvenomous insect and other nonvenomous arthropods, initial encounter: Secondary | ICD-10-CM | POA: Diagnosis not present

## 2015-09-21 DIAGNOSIS — I10 Essential (primary) hypertension: Secondary | ICD-10-CM | POA: Diagnosis not present

## 2015-09-21 DIAGNOSIS — Y9289 Other specified places as the place of occurrence of the external cause: Secondary | ICD-10-CM | POA: Insufficient documentation

## 2015-09-21 DIAGNOSIS — Z792 Long term (current) use of antibiotics: Secondary | ICD-10-CM | POA: Insufficient documentation

## 2015-09-21 DIAGNOSIS — Z7982 Long term (current) use of aspirin: Secondary | ICD-10-CM | POA: Diagnosis not present

## 2015-09-21 DIAGNOSIS — R21 Rash and other nonspecific skin eruption: Secondary | ICD-10-CM | POA: Diagnosis present

## 2015-09-21 DIAGNOSIS — Y9389 Activity, other specified: Secondary | ICD-10-CM | POA: Diagnosis not present

## 2015-09-21 DIAGNOSIS — T148 Other injury of unspecified body region: Secondary | ICD-10-CM | POA: Diagnosis not present

## 2015-09-21 DIAGNOSIS — Y998 Other external cause status: Secondary | ICD-10-CM | POA: Insufficient documentation

## 2015-09-21 MED ORDER — PERMETHRIN 5 % EX CREA: 1.0000 "application " | TOPICAL_CREAM | Freq: Once | CUTANEOUS | Status: DC

## 2015-09-21 MED ORDER — HYDROXYZINE HCL 10 MG/5ML PO SYRP: 10.0000 mg | ORAL_SOLUTION | Freq: Three times a day (TID) | ORAL | Status: DC | PRN

## 2015-09-21 MED ORDER — METHYLPREDNISOLONE 4 MG PO TBPK: ORAL_TABLET | ORAL | Status: DC

## 2015-09-21 NOTE — ED Notes (Signed)
See triage note. Rash for several days   With itching no resp distress noted

## 2015-09-21 NOTE — ED Provider Notes (Signed)
[facility] Emergency Department Provider Note  ____________________________________________  Time seen: Approximately 9:23 AM  I have reviewed the triage vital signs and the nursing notes.   HISTORY  Chief Complaint Rash    HPI Carla [name] is a 55 y.o. female patient complaining of itching proximally 2 weeks. Patient is a onset was after she came in was diagnosed with bronchitis she went home and sprayed her bed with Lysol. Patient denies fever or any hotels/ motels in the past month. She has applied alcohol to the area. Patient cannot give a history of nocturnal itching because she works night shift but she states itching does seems to intensify when she goes to sleep. No other palliative measures for this complaint. Patient denies any pain associated this complaint.   Past Medical History  Diagnosis Date  . Hypertension   . Myocardial infarct, old   . Coronary artery disease     There are no active problems to display for this patient.   Past Surgical History  Procedure Laterality Date  . Carpal tunnel release    . Abdominal hysterectomy      Current Outpatient Rx  Name  Route  Sig  Dispense  Refill  . amLODipine (NORVASC) 5 MG tablet   Oral   Take 1 tablet by mouth daily.         Marland Kitchen aspirin EC 81 MG tablet   Oral   Take 1 tablet by mouth daily.         Marland Kitchen azithromycin (ZITHROMAX) 500 MG tablet   Oral   Take 1 tablet (500 mg total) by mouth daily. Take 1 tablet daily for 3 days.   3 tablet   0   . benzonatate (TESSALON PERLES) 100 MG capsule   Oral   Take 1 capsule (100 mg total) by mouth 3 (three) times daily as needed for cough (Take 1-2 per dose).   30 capsule   0   . chlorpheniramine-HYDROcodone (TUSSIONEX PENNKINETIC ER) 10-8 MG/5ML SUER   Oral   Take 5 mLs by mouth 2 (two) times daily.   115 mL   0   . dicyclomine (BENTYL) 20 MG tablet   Oral   Take 1 tablet (20 mg total) by mouth 3 (three) times daily as needed  for spasms.   30 tablet   0   . hydrochlorothiazide (HYDRODIURIL) 12.5 MG tablet   Oral   Take 1 tablet by mouth daily.         . hydrOXYzine (ATARAX) 10 MG/5ML syrup   Oral   Take 5 mLs (10 mg total) by mouth 3 (three) times daily as needed for itching.   240 mL   0   . ibuprofen (ADVIL,MOTRIN) 50 MG chewable tablet   Oral   Chew 1 tablet (50 mg total) by mouth every 8 (eight) hours as needed for fever.   24 tablet   2   . lisinopril (PRINIVIL,ZESTRIL) 30 MG tablet   Oral   Take 1 tablet by mouth daily.         . meloxicam (MOBIC) 15 MG tablet   Oral   Take 1 tablet (15 mg total) by mouth daily. Patient not taking: Reported on 08/21/2015   30 tablet   0   . methylPREDNISolone (MEDROL DOSEPAK) 4 MG TBPK tablet      Take Tapered dose as directed   21 tablet   0   . metoprolol (LOPRESSOR) 50 MG tablet   Oral  Take 50 mg by mouth daily.         . pantoprazole (PROTONIX) 40 MG tablet   Oral   Take 1 tablet by mouth daily.         . permethrin (ELIMITE) 5 % cream   Topical   Apply 1 application topically once.   60 g   0     Allergies Review of patient's allergies indicates no known allergies.  No family history on file.  Social History Social History  Substance Use Topics  . Smoking status: Current Every Day Smoker -- 0.50 packs/day    Types: Cigarettes  . Smokeless tobacco: Not on file  . Alcohol Use: Not on file    Review of Systems Constitutional: No fever/chills Eyes: No visual changes. ENT: No sore throat. Cardiovascular: Denies chest pain. Respiratory: Denies shortness of breath. Gastrointestinal: No abdominal pain.  No nausea, no vomiting.  No diarrhea.  No constipation. Genitourinary: Negative for dysuria. Musculoskeletal: Negative for back pain. Skin: Positive for rash. Neurological: Negative for headaches, focal weakness or numbness. 10-point ROS otherwise negative.  ____________________________________________   PHYSICAL  EXAM:  VITAL SIGNS: ED Triage Vitals  Enc Vitals Group     BP 09/21/15 0816 129/61 mmHg     Pulse Rate 09/21/15 0816 77     Resp 09/21/15 0816 18     Temp 09/21/15 0816 97.6 F (36.4 C)     Temp Source 09/21/15 0816 Oral     SpO2 09/21/15 0816 98 %     Weight 09/21/15 0816 172 lb (78.019 kg)     Height 09/21/15 0816 5\' 3"  (1.6 m)     Head Cir --      Peak Flow --      Pain Score --      Pain Loc --      Pain Edu? --      Excl. in GC? --     Constitutional: Alert and oriented. Well appearing and in no acute distress. Eyes: Conjunctivae are normal. PERRL. EOMI. Head: Atraumatic. Nose: No congestion/rhinnorhea. Mouth/Throat: Mucous membranes are moist.  Oropharynx non-erythematous. Neck: No stridor. No cervical spine tenderness to palpation. Hematological/Lymphatic/Immunilogical: No cervical lymphadenopathy. Cardiovascular: Normal rate, regular rhythm. Grossly normal heart sounds.  Good peripheral circulation. Respiratory: Normal respiratory effort.  No retractions. Lungs CTAB. Gastrointestinal: Soft and nontender. No distention. No abdominal bruits. No CVA tenderness. Musculoskeletal: No lower extremity tenderness nor edema.  No joint effusions. Neurologic:  Normal speech and language. No gross focal neurologic deficits are appreciated. No gait instability. Skin:  Skin is warm, dry and intact. No rash noted. Signs of excoriation. Psychiatric: Mood and affect are normal. Speech and behavior are normal.  ____________________________________________   LABS (all labs ordered are listed, but only abnormal results are displayed)  Labs Reviewed - No data to display ____________________________________________  EKG   ____________________________________________  RADIOLOGY   ____________________________________________   PROCEDURES  Procedure(s) performed: None  Critical Care performed: No  ____________________________________________   INITIAL IMPRESSION /  ASSESSMENT AND PLAN / ED COURSE  Pertinent labs & imaging results that were available during my care of the patient were reviewed by me and considered in my medical decision making (see chart for details).  Bedbug bites. Patient given discharge instructions. Patient given a prescription for Elimite and Atarax.  Follow-up family doctor for reevaluation. ____________________________________________   FINAL CLINICAL IMPRESSION(S) / ED DIAGNOSES  Final diagnoses:  Bedbug bite      Joni Reiningonald K Smith, PA-C 09/21/15 [phone]  Rockne Menghini, MD 09/21/15 1536

## 2015-09-21 NOTE — ED Notes (Signed)
Pt reports rash on her stomach, rash not visible to this triage RN. Pt reports it "itches". Pt denies other symptoms

## 2015-09-21 NOTE — Discharge Instructions (Signed)
Take medications as directed

## 2016-10-22 ENCOUNTER — Encounter: Payer: Self-pay | Admitting: Emergency Medicine

## 2016-10-22 ENCOUNTER — Emergency Department
Admission: EM | Admit: 2016-10-22 | Discharge: 2016-10-22 | Disposition: A | Discharge status: Home / Self Care | Payer: BLUE CROSS/BLUE SHIELD | Attending: Emergency Medicine | Admitting: Emergency Medicine

## 2016-10-22 DIAGNOSIS — S0592XA Unspecified injury of left eye and orbit, initial encounter: Secondary | ICD-10-CM | POA: Diagnosis present

## 2016-10-22 DIAGNOSIS — X58XXXA Exposure to other specified factors, initial encounter: Secondary | ICD-10-CM | POA: Insufficient documentation

## 2016-10-22 DIAGNOSIS — Y939 Activity, unspecified: Secondary | ICD-10-CM | POA: Insufficient documentation

## 2016-10-22 DIAGNOSIS — Z7982 Long term (current) use of aspirin: Secondary | ICD-10-CM | POA: Insufficient documentation

## 2016-10-22 DIAGNOSIS — Z79899 Other long term (current) drug therapy: Secondary | ICD-10-CM | POA: Insufficient documentation

## 2016-10-22 DIAGNOSIS — I10 Essential (primary) hypertension: Secondary | ICD-10-CM | POA: Diagnosis not present

## 2016-10-22 DIAGNOSIS — F17210 Nicotine dependence, cigarettes, uncomplicated: Secondary | ICD-10-CM | POA: Diagnosis not present

## 2016-10-22 DIAGNOSIS — S0502XA Injury of conjunctiva and corneal abrasion without foreign body, left eye, initial encounter: Secondary | ICD-10-CM | POA: Insufficient documentation

## 2016-10-22 DIAGNOSIS — Y999 Unspecified external cause status: Secondary | ICD-10-CM | POA: Insufficient documentation

## 2016-10-22 DIAGNOSIS — Y929 Unspecified place or not applicable: Secondary | ICD-10-CM | POA: Diagnosis not present

## 2016-10-22 MED ORDER — FLUORESCEIN SODIUM 1 MG OP STRP
ORAL_STRIP | OPHTHALMIC | Status: AC
  Administered 2016-10-22: 08:00:00
  Filled 2016-10-22: qty 1

## 2016-10-22 MED ORDER — GENTAMICIN SULFATE 0.3 % OP SOLN: 1.0000 [drp] | OPHTHALMIC | 0 refills | Status: DC

## 2016-10-22 MED ORDER — NAPHAZOLINE HCL 0.1 % OP SOLN: 1.0000 [drp] | Freq: Four times a day (QID) | OPHTHALMIC | 0 refills | Status: DC | PRN

## 2016-10-22 MED ORDER — EYE WASH OPHTH SOLN
OPHTHALMIC | Status: AC
  Administered 2016-10-22: 08:00:00
  Filled 2016-10-22: qty 118

## 2016-10-22 MED ORDER — TETRACAINE HCL 0.5 % OP SOLN
OPHTHALMIC | Status: AC
  Administered 2016-10-22: 08:00:00
  Filled 2016-10-22: qty 2

## 2016-10-22 NOTE — ED Notes (Signed)
Pt verbalized understanding of discharge instructions. NAD at this time. 

## 2016-10-22 NOTE — ED Triage Notes (Signed)
Lower L eyelid irritation x 2 days. Does not think she got anything in it.

## 2016-10-22 NOTE — ED Provider Notes (Signed)
[facility] Emergency Department Provider Note   ____________________________________________   First MD Initiated Contact with Patient 10/22/16 [phone]     (approximate)  I have reviewed the triage vital signs and the nursing notes.   HISTORY  Chief Complaint Eye Pain    HPI Carla [name] is a 56 y.o. female patient complaining of left eye pain for 2 days. Patient states she believes an eyelash from the lower eyelid scratched her cornea. Patient denies vision loss. Patient state there is been a foreign body sensation and some lower eyelid edema. Patient rates the pain as a 5/10. No palliative measures taken for this complaint. Past Medical History:  Diagnosis Date  . Coronary artery disease   . Hypertension   . Myocardial infarct, old     There are no active problems to display for this patient.   Past Surgical History:  Procedure Laterality Date  . ABDOMINAL HYSTERECTOMY    . CARPAL TUNNEL RELEASE      Prior to Admission medications   Medication Sig Start Date End Date Taking? Authorizing Provider  amLODipine (NORVASC) 5 MG tablet Take 1 tablet by mouth daily. 06/10/15 06/09/16  Historical Provider, MD  aspirin EC 81 MG tablet Take 1 tablet by mouth daily. 01/21/14   Historical Provider, MD  azithromycin (ZITHROMAX) 500 MG tablet Take 1 tablet (500 mg total) by mouth daily. Take 1 tablet daily for 3 days. 09/08/15   Joni Reining, PA-C  benzonatate (TESSALON PERLES) 100 MG capsule Take 1 capsule (100 mg total) by mouth 3 (three) times daily as needed for cough (Take 1-2 per dose). 09/10/15   Jenise V Bacon Menshew, PA-C  chlorpheniramine-HYDROcodone (TUSSIONEX PENNKINETIC ER) 10-8 MG/5ML SUER Take 5 mLs by mouth 2 (two) times daily. 09/08/15   Joni Reining, PA-C  dicyclomine (BENTYL) 20 MG tablet Take 1 tablet (20 mg total) by mouth 3 (three) times daily as needed for spasms. 08/21/15   Jennye Moccasin, MD  gentamicin (GARAMYCIN) 0.3 % ophthalmic  solution Place 1 drop into the left eye every 4 (four) hours. 10/22/16   Joni Reining, PA-C  hydrochlorothiazide (HYDRODIURIL) 12.5 MG tablet Take 1 tablet by mouth daily. 03/25/15   Historical Provider, MD  hydrOXYzine (ATARAX) 10 MG/5ML syrup Take 5 mLs (10 mg total) by mouth 3 (three) times daily as needed for itching. 09/21/15   Joni Reining, PA-C  lisinopril (PRINIVIL,ZESTRIL) 30 MG tablet Take 1 tablet by mouth daily.    Historical Provider, MD  meloxicam (MOBIC) 15 MG tablet Take 1 tablet (15 mg total) by mouth daily. Patient not taking: Reported on 08/21/2015 06/19/15   Delorise Royals Cuthriell, PA-C  methylPREDNISolone (MEDROL DOSEPAK) 4 MG TBPK tablet Take Tapered dose as directed 09/21/15   Joni Reining, PA-C  metoprolol (LOPRESSOR) 50 MG tablet Take 50 mg by mouth daily.    Historical Provider, MD  naphazoline (NAPHCON) 0.1 % ophthalmic solution Place 1 drop into both eyes 4 (four) times daily as needed for irritation. 10/22/16   Joni Reining, PA-C  pantoprazole (PROTONIX) 40 MG tablet Take 1 tablet by mouth daily.    Historical Provider, MD  permethrin (ELIMITE) 5 % cream Apply 1 application topically once. 09/21/15   Joni Reining, PA-C    Allergies Patient has no known allergies.  No family history on file.  Social History Social History  Substance Use Topics  . Smoking status: Current Every Day Smoker    Packs/day: 0.50  Types: Cigarettes  . Smokeless tobacco: Not on file  . Alcohol use Not on file    Review of Systems Constitutional: No fever/chills Eyes: No visual changes. Left eye pain ENT: No sore throat. Cardiovascular: Denies chest pain. Respiratory: Denies shortness of breath. Gastrointestinal: No abdominal pain.  No nausea, no vomiting.  No diarrhea.  No constipation. Genitourinary: Negative for dysuria. Musculoskeletal: Negative for back pain. Skin: Negative for rash. Neurological: Negative for headaches, focal weakness or  numbness. Endocrine:Hypertension ____________________________________________   PHYSICAL EXAM:  VITAL SIGNS: ED Triage Vitals   Enc Vitals Group     BP 137/65     Pulse Rate 81     Resp 18     Temp 97.8 F (36.6 C)     Temp Source Oral     SpO2 100 %     Weight 175 lb (79.4 kg)     Height  (1.6 m)     Head Circumference      Peak Flow      Pain Score 5     Pain Loc      Pain Edu?      Excl. in GC?     Constitutional: Alert and oriented. Well appearing and in no acute distress. Eyes: Conjunctivae are normal. PERRL. EOMI. Fluoro- stain revealed left cornea abrasion Head: Atraumatic. Nose: No congestion/rhinnorhea. Mouth/Throat: Mucous membranes are moist.  Oropharynx non-erythematous. Neck: No stridor.  No cervical spine tenderness to palpation. Hematological/Lymphatic/Immunilogical: No cervical lymphadenopathy. Cardiovascular: Normal rate, regular rhythm. Grossly normal heart sounds.  Good peripheral circulation. Respiratory: Normal respiratory effort.  No retractions. Lungs CTAB. Gastrointestinal: Soft and nontender. No distention. No abdominal bruits. No CVA tenderness. Musculoskeletal: No lower extremity tenderness nor edema.  No joint effusions. Neurologic:  Normal speech and language. No gross focal neurologic deficits are appreciated. No gait instability. Skin:  Skin is warm, dry and intact. No rash noted. Psychiatric: Mood and affect are normal. Speech and behavior are normal.  ____________________________________________   LABS (all labs ordered are listed, but only abnormal results are displayed)  Labs Reviewed - No data to display ____________________________________________  EKG   ____________________________________________  RADIOLOGY   ____________________________________________   PROCEDURES  Procedure(s) performed: None  Procedures  Critical Care performed: No  ____________________________________________   INITIAL IMPRESSION  / ASSESSMENT AND PLAN / ED COURSE  Pertinent labs & imaging results that were available during my care of the patient were reviewed by me and considered in my medical decision making (see chart for details).  Left eye pain.      ____________________________________________   FINAL CLINICAL IMPRESSION(S) / ED DIAGNOSES  Final diagnoses:  Abrasion of left cornea, initial encounter      NEW MEDICATIONS STARTED DURING THIS VISIT:  New Prescriptions   GENTAMICIN (GARAMYCIN) 0.3 % OPHTHALMIC SOLUTION    Place 1 drop into the left eye every 4 (four) hours.   NAPHAZOLINE (NAPHCON) 0.1 % OPHTHALMIC SOLUTION    Place 1 drop into both eyes 4 (four) times daily as needed for irritation.     Note:  This document was prepared using Dragon voice recognition software and may include unintentional dictation errors.    Joni Reining, PA-C 10/22/16 0454    Minna Antis, MD 10/22/16 1249

## 2017-02-05 ENCOUNTER — Emergency Department
Admission: EM | Admit: 2017-02-05 | Discharge: 2017-02-05 | Disposition: A | Discharge status: Home / Self Care | Payer: BLUE CROSS/BLUE SHIELD | Attending: Emergency Medicine | Admitting: Emergency Medicine

## 2017-02-05 ENCOUNTER — Emergency Department: Payer: BLUE CROSS/BLUE SHIELD

## 2017-02-05 DIAGNOSIS — M79662 Pain in left lower leg: Secondary | ICD-10-CM | POA: Diagnosis not present

## 2017-02-05 DIAGNOSIS — I10 Essential (primary) hypertension: Secondary | ICD-10-CM | POA: Diagnosis not present

## 2017-02-05 DIAGNOSIS — R2242 Localized swelling, mass and lump, left lower limb: Secondary | ICD-10-CM | POA: Insufficient documentation

## 2017-02-05 DIAGNOSIS — I252 Old myocardial infarction: Secondary | ICD-10-CM | POA: Insufficient documentation

## 2017-02-05 DIAGNOSIS — I2510 Atherosclerotic heart disease of native coronary artery without angina pectoris: Secondary | ICD-10-CM | POA: Diagnosis not present

## 2017-02-05 DIAGNOSIS — F17210 Nicotine dependence, cigarettes, uncomplicated: Secondary | ICD-10-CM | POA: Diagnosis not present

## 2017-02-05 DIAGNOSIS — Z7982 Long term (current) use of aspirin: Secondary | ICD-10-CM | POA: Insufficient documentation

## 2017-02-05 DIAGNOSIS — Z79899 Other long term (current) drug therapy: Secondary | ICD-10-CM | POA: Diagnosis not present

## 2017-02-05 DIAGNOSIS — M7989 Other specified soft tissue disorders: Secondary | ICD-10-CM

## 2017-02-05 MED ORDER — IBUPROFEN 600 MG PO TABS: 600.0000 mg | ORAL_TABLET | Freq: Four times a day (QID) | ORAL | 0 refills | Status: DC | PRN

## 2017-02-05 MED ORDER — IBUPROFEN 600 MG PO TABS
600.0000 mg | ORAL_TABLET | Freq: Once | ORAL | Status: AC
  Administered 2017-02-05: 600 mg via ORAL

## 2017-02-05 MED ORDER — IBUPROFEN 600 MG PO TABS
ORAL_TABLET | ORAL | Status: AC
  Filled 2017-02-05: qty 1

## 2017-02-05 NOTE — ED Provider Notes (Signed)
[facility] Emergency Department Provider Note   ____________________________________________   I have reviewed the triage vital signs and the nursing notes.   HISTORY  Chief Complaint Leg Swelling    HPI Carla [name] is a 56 y.o. female presents to the emergency department with left lower leg pain and swelling that developed 2 days ago. Patient denies any recent traumatic injury and symptoms worsen with long-term standing or walking activities. Patient denies any numbness or tingling or loss of pulses in the lower extremity. Patient denies any recent surgeries, long travels, increased sedentary activity, or other risk factors for DVT. Patient recently started a new statin drug for cholesterol. Patient denies fever, chills, headache, vision changes, chest pain, chest tightness, shortness of breath, abdominal pain, nausea and vomiting.  Past Medical History:  Diagnosis Date  . Coronary artery disease   . Hypertension   . Myocardial infarct, old     There are no active problems to display for this patient.   Past Surgical History:  Procedure Laterality Date  . ABDOMINAL HYSTERECTOMY    . CARPAL TUNNEL RELEASE      Prior to Admission medications   Medication Sig Start Date End Date Taking? Authorizing Provider  amLODipine (NORVASC) 5 MG tablet Take 1 tablet by mouth daily. 06/10/15 06/09/16  [provider]  aspirin EC 81 MG tablet Take 1 tablet by mouth daily. 01/21/14   [provider]  azithromycin (ZITHROMAX) 500 MG tablet Take 1 tablet (500 mg total) by mouth daily. Take 1 tablet daily for 3 days. 09/08/15   Joni Reining, PA-C  benzonatate (TESSALON PERLES) 100 MG capsule Take 1 capsule (100 mg total) by mouth 3 (three) times daily as needed for cough (Take 1-2 per dose). 09/10/15   Menshew, Charlesetta Ivory, PA-C  chlorpheniramine-HYDROcodone (TUSSIONEX PENNKINETIC ER) 10-8 MG/5ML SUER Take 5 mLs by mouth 2 (two) times daily.  09/08/15   Joni Reining, PA-C  dicyclomine (BENTYL) 20 MG tablet Take 1 tablet (20 mg total) by mouth 3 (three) times daily as needed for spasms. 08/21/15   Jennye Moccasin, MD  gentamicin (GARAMYCIN) 0.3 % ophthalmic solution Place 1 drop into the left eye every 4 (four) hours. 10/22/16   Joni Reining, PA-C  hydrochlorothiazide (HYDRODIURIL) 12.5 MG tablet Take 1 tablet by mouth daily. 03/25/15   [provider]  hydrOXYzine (ATARAX) 10 MG/5ML syrup Take 5 mLs (10 mg total) by mouth 3 (three) times daily as needed for itching. 09/21/15   Joni Reining, PA-C  ibuprofen (ADVIL,MOTRIN) 600 MG tablet Take 1 tablet (600 mg total) by mouth every 6 (six) hours as needed. 02/05/17   Amel Gianino M, PA-C  lisinopril (PRINIVIL,ZESTRIL) 30 MG tablet Take 1 tablet by mouth daily.    [provider]  meloxicam (MOBIC) 15 MG tablet Take 1 tablet (15 mg total) by mouth daily. Patient not taking: Reported on 08/21/2015 06/19/15   Cuthriell, Delorise Royals, PA-C  methylPREDNISolone (MEDROL DOSEPAK) 4 MG TBPK tablet Take Tapered dose as directed 09/21/15   Joni Reining, PA-C  metoprolol (LOPRESSOR) 50 MG tablet Take 50 mg by mouth daily.    [provider]  naphazoline (NAPHCON) 0.1 % ophthalmic solution Place 1 drop into both eyes 4 (four) times daily as needed for irritation. 10/22/16   Joni Reining, PA-C  pantoprazole (PROTONIX) 40 MG tablet Take 1 tablet by mouth daily.    [provider]  permethrin (ELIMITE) 5 % cream  Apply 1 application topically once. 09/21/15   Joni Reining, PA-C    Allergies Patient has no known allergies.  No family history on file.  Social History Social History  Substance Use Topics  . Smoking status: Current Every Day Smoker    Packs/day: 0.50    Types: Cigarettes  . Smokeless tobacco: Not on file  . Alcohol use No    Review of Systems Constitutional: Negative for fever/chills Eyes: No visual changes. Cardiovascular: Denies chest  pain. Respiratory: Denies cough. Denies shortness of breath. Gastrointestinal: No abdominal pain.  No nausea, vomiting, diarrhea. Musculoskeletal: Negative for back pain. Right lower leg pain and swelling. Skin: Negative for rash. Neurological: Negative for headaches. ____________________________________________   PHYSICAL EXAM:  VITAL SIGNS: ED Triage Vitals  Enc Vitals Group     BP 02/05/17 1817 128/68     Pulse Rate 02/05/17 1817 78     Resp 02/05/17 1817 18     Temp 02/05/17 1817 99.3 F (37.4 C)     Temp Source 02/05/17 1817 Oral     SpO2 02/05/17 1817 99 %     Weight 02/05/17 1817 172 lb (78 kg)     Height 02/05/17 1817 5\' 3"  (1.6 m)     Head Circumference --      Peak Flow --      Pain Score 02/05/17 1816 2     Pain Loc --      Pain Edu? --      Excl. in GC? --     Constitutional: Alert and oriented. Well appearing and in no acute distress.  Head: Normocephalic and atraumatic. Eyes: Conjunctivae are normal. PERRL.  Cardiovascular: Normal rate, regular rhythm. Normal distal pulses. Respiratory: Normal respiratory effort. No wheezes/rales/rhonchi. Lungs CTAB  Gastrointestinal: Soft and nontender. No distention. Musculoskeletal:Right lower extremity range of motion, strength and sensation intact. Increased right lower leg pain related to swelling noted from the knee down to the ankle. Otherwise, nontender with normal range of motion in all extremities. Neurologic: Normal speech and language.  Skin:  Skin is warm, dry and intact. No rash noted. Psychiatric: Mood and affect are normal.  ____________________________________________   LABS (all labs ordered are listed, but only abnormal results are displayed)  Labs Reviewed - No data to display ____________________________________________  EKG none ____________________________________________  RADIOLOGY [place] IMG lower unilateral left IMPRESSION: No evidence of DVT within the left lower  extremity ____________________________________________   PROCEDURES  Procedure(s) performed: no    Critical Care performed: no ____________________________________________   INITIAL IMPRESSION / ASSESSMENT AND PLAN / ED COURSE  Pertinent labs & imaging results that were available during my care of the patient were reviewed by me and considered in my medical decision making (see chart for details).  Patient presents to emergency department with right lower leg swelling and pain that developed 2 days ago.Marland Kitchen History, physical exam findings, and imaging are reassuring symptoms are likely a drug reaction to statin drugs. Imaging was unremarkable for DVT in the left lower leg. Patient noted decreased pain following ibuprofen during the course of care in the emergency department. Recommended the patient continue ibuprofen as needed for symptom management, she wear a light compression sock or hose during standing activities. Patient advised to follow up with PCP as needed or return to the emergency department if symptoms return or worsen. Patient informed of clinical course, understand medical decision-making process, and agree with plan.     ____________________________________________   FINAL CLINICAL IMPRESSION(S) / ED DIAGNOSES  Final diagnoses:  Pain and swelling of left lower leg       NEW MEDICATIONS STARTED DURING THIS VISIT:  New Prescriptions   IBUPROFEN (ADVIL,MOTRIN) 600 MG TABLET    Take 1 tablet (600 mg total) by mouth every 6 (six) hours as needed.     Note:  This document was prepared using Dragon voice recognition software and may include unintentional dictation errors.    Laikyn Gewirtz, Karl Pockraci M, PA-C 02/05/17 2154    Sharman CheekStafford, Phillip, MD 02/05/17 [phone]

## 2017-02-05 NOTE — ED Notes (Signed)
Patient transported to Ultrasound via stretcher with Lupita Leashonna, US tech

## 2017-02-05 NOTE — ED Notes (Signed)
Pt reports pain and swelling to left lower shin area for 2 days; denies injury; no swelling to calf noted;

## 2017-02-05 NOTE — ED Triage Notes (Signed)
Pt states that he has been having swelling on L calf since yesterday.  Pt states the swelling went down after work yesterday, but came back today.  Pt states she may have hit it against something at work, but also states that she works on her feet for long periods of time.  Pt states she is sore towards her shin area and has been putting alcohol and icy hot on that area.  Pt is ambulatory to triage and in NAD at this time.

## 2017-04-22 ENCOUNTER — Emergency Department
Admission: EM | Admit: 2017-04-22 | Discharge: 2017-04-22 | Disposition: A | Discharge status: Home / Self Care | Payer: BLUE CROSS/BLUE SHIELD | Attending: Emergency Medicine | Admitting: Emergency Medicine

## 2017-04-22 ENCOUNTER — Encounter: Payer: Self-pay | Admitting: Emergency Medicine

## 2017-04-22 DIAGNOSIS — F17210 Nicotine dependence, cigarettes, uncomplicated: Secondary | ICD-10-CM | POA: Insufficient documentation

## 2017-04-22 DIAGNOSIS — R21 Rash and other nonspecific skin eruption: Secondary | ICD-10-CM | POA: Diagnosis present

## 2017-04-22 DIAGNOSIS — I10 Essential (primary) hypertension: Secondary | ICD-10-CM | POA: Insufficient documentation

## 2017-04-22 DIAGNOSIS — L309 Dermatitis, unspecified: Secondary | ICD-10-CM | POA: Insufficient documentation

## 2017-04-22 DIAGNOSIS — I2510 Atherosclerotic heart disease of native coronary artery without angina pectoris: Secondary | ICD-10-CM | POA: Diagnosis not present

## 2017-04-22 DIAGNOSIS — L232 Allergic contact dermatitis due to cosmetics: Secondary | ICD-10-CM | POA: Diagnosis not present

## 2017-04-22 DIAGNOSIS — Z7982 Long term (current) use of aspirin: Secondary | ICD-10-CM | POA: Diagnosis not present

## 2017-04-22 DIAGNOSIS — Z79899 Other long term (current) drug therapy: Secondary | ICD-10-CM | POA: Insufficient documentation

## 2017-04-22 DIAGNOSIS — I252 Old myocardial infarction: Secondary | ICD-10-CM | POA: Insufficient documentation

## 2017-04-22 MED ORDER — HYDROXYZINE HCL 25 MG PO TABS: 25.0000 mg | ORAL_TABLET | Freq: Three times a day (TID) | ORAL | 0 refills | Status: DC | PRN

## 2017-04-22 MED ORDER — METHYLPREDNISOLONE 4 MG PO TBPK: ORAL_TABLET | ORAL | 0 refills | Status: DC

## 2017-04-22 MED ORDER — LORATADINE 10 MG PO TABS
10.0000 mg | ORAL_TABLET | Freq: Once | ORAL | Status: AC
  Administered 2017-04-22: 10 mg via ORAL
  Filled 2017-04-22: qty 1

## 2017-04-22 NOTE — Discharge Instructions (Signed)
She instructed to use the steroid pack as directed and Atarax as needed for itching.  Patient is to stop using the lotion that created the rash. Recommended she use Cetaphil.  Patient is to follow-up with her primary care provider if the rash is not improved in one week. Recommended she asked for lab work for her thyroid if the rash is not disappeared.

## 2017-04-22 NOTE — ED Triage Notes (Signed)
Rash both arms x 1 week.

## 2017-04-22 NOTE — ED Provider Notes (Signed)
[facility] Emergency Department Provider Note  ____________________________________________   First MD Initiated Contact with Patient 04/22/17 1324     (approximate)  I have reviewed the triage vital signs and the nursing notes.   HISTORY  Chief Complaint Rash    HPI Carla [name] is a 56 y.o. female complains of rash on both of her forearms after using a new lotion. The rash is been in for 2 weeks. States it is been very dry and itchy. Used alcohol on the area to see if it would help. Recently bought cetaphil lotion to help.  Denies any other new products or foods. Does state she is going through the change so her skin has changed.   Past Medical History:  Diagnosis Date  . Coronary artery disease   . Hypertension   . Myocardial infarct, old     There are no active problems to display for this patient.   Past Surgical History:  Procedure Laterality Date  . ABDOMINAL HYSTERECTOMY    . CARPAL TUNNEL RELEASE      Prior to Admission medications   Medication Sig Start Date End Date Taking? Authorizing Provider  amLODipine (NORVASC) 5 MG tablet Take 1 tablet by mouth daily. 06/10/15 06/09/16  [provider]  aspirin EC 81 MG tablet Take 1 tablet by mouth daily. 01/21/14   [provider]  hydrochlorothiazide (HYDRODIURIL) 12.5 MG tablet Take 1 tablet by mouth daily. 03/25/15   [provider]  hydrOXYzine (ATARAX/VISTARIL) 25 MG tablet Take 1 tablet (25 mg total) by mouth 3 (three) times daily as needed. 04/22/17   Srihaan Mastrangelo, Roselyn Bering, PA-C  lisinopril (PRINIVIL,ZESTRIL) 30 MG tablet Take 1 tablet by mouth daily.    [provider]  methylPREDNISolone (MEDROL DOSEPAK) 4 MG TBPK tablet Take 6 pills on day one then decrease by 1 pill each day 04/22/17   Faythe Ghee, PA-C  metoprolol (LOPRESSOR) 50 MG tablet Take 50 mg by mouth daily.    [provider]  pantoprazole (PROTONIX) 40 MG tablet Take 1 tablet by  mouth daily.    [provider]    Allergies Patient has no known allergies.  No family history on file.  Social History Social History  Substance Use Topics  . Smoking status: Current [name] Day Smoker    Packs/day: 0.50    Types: Cigarettes  . Smokeless tobacco: Not on file  . Alcohol use No    Review of Systems  Constitutional: No fever/chills Eyes: No visual changes. ENT: No sore throat. Respiratory: Denies cough Genitourinary: Negative for dysuria. Musculoskeletal: Negative for back pain. Skin: Positive for rash    ____________________________________________   PHYSICAL EXAM:  VITAL SIGNS: ED Triage Vitals [04/22/17 1319]  Enc Vitals Group     BP (!) 99/47     Pulse Rate 69     Resp 18     Temp 98.6 F (37 C)     Temp Source Oral     SpO2 99 %     Weight 165 lb (74.8 kg)     Height  (1.6 m)     Head Circumference      Peak Flow      Pain Score      Pain Loc      Pain Edu?      Excl. in GC?     Constitutional: Alert and oriented. Well appearing and in no acute distress. Eyes: Conjunctivae are normal.  Head: Atraumatic. Nose: No  congestion/rhinnorhea. Mouth/Throat: Mucous membranes are moist.   Cardiovascular: Normal rate, regular rhythm. Respiratory: Normal respiratory effort.  No retractions GU: deferred Musculoskeletal: FROM all extremities, warm and well perfused Neurologic:  Normal speech and language.  Skin:  Skin is warm, dry and intact.Fine raised rash without redness on both forearms. No pustules or vesicles noted. No burrows noted. The area is dry and rough to touch  Psychiatric: Mood and affect are normal. Speech and behavior are normal.  ____________________________________________   LABS (all labs ordered are listed, but only abnormal results are displayed)  Labs Reviewed - No data to  display ____________________________________________   ____________________________________________  RADIOLOGY   ____________________________________________   PROCEDURES  Procedure(s) performed:       ____________________________________________   INITIAL IMPRESSION / ASSESSMENT AND PLAN / ED COURSE  Pertinent labs & imaging results that were available during my care of the patient were reviewed by me and considered in my medical decision making (see chart for details).  Patient is a 56 year old female with a contact dermatitis on the forearms bilaterally. She also has eczema. Prescribed Atarax for the itching. Prescribed a Medrol dose pack for the inflammation and allergic reaction. Patient is to follow-up with her regular doctor. Discussed hormone changes and questionable thyroid changes that make skin dry. Patient states she understands and will call her doctor.      ____________________________________________   FINAL CLINICAL IMPRESSION(S) / ED DIAGNOSES  Final diagnoses:  Rash  Eczema, unspecified type  Allergic contact dermatitis due to cosmetics      NEW MEDICATIONS STARTED DURING THIS VISIT:  New Prescriptions   HYDROXYZINE (ATARAX/VISTARIL) 25 MG TABLET    Take 1 tablet (25 mg total) by mouth 3 (three) times daily as needed.   METHYLPREDNISOLONE (MEDROL DOSEPAK) 4 MG TBPK TABLET    Take 6 pills on day one then decrease by 1 pill each day     Note:  This document was prepared using Dragon voice recognition software and may include unintentional dictation errors.    Faythe Ghee, PA-C 04/22/17 1341    Carla Every, MD 04/22/17 [phone]

## 2017-07-06 ENCOUNTER — Emergency Department: Payer: BLUE CROSS/BLUE SHIELD

## 2017-07-06 ENCOUNTER — Encounter: Payer: Self-pay | Admitting: Emergency Medicine

## 2017-07-06 ENCOUNTER — Emergency Department
Admission: EM | Admit: 2017-07-06 | Discharge: 2017-07-06 | Disposition: A | Discharge status: Home / Self Care | Payer: BLUE CROSS/BLUE SHIELD | Attending: Emergency Medicine | Admitting: Emergency Medicine

## 2017-07-06 ENCOUNTER — Other Ambulatory Visit: Payer: Self-pay

## 2017-07-06 DIAGNOSIS — I252 Old myocardial infarction: Secondary | ICD-10-CM | POA: Insufficient documentation

## 2017-07-06 DIAGNOSIS — I2510 Atherosclerotic heart disease of native coronary artery without angina pectoris: Secondary | ICD-10-CM | POA: Insufficient documentation

## 2017-07-06 DIAGNOSIS — K2900 Acute gastritis without bleeding: Secondary | ICD-10-CM | POA: Diagnosis not present

## 2017-07-06 DIAGNOSIS — R1013 Epigastric pain: Secondary | ICD-10-CM

## 2017-07-06 DIAGNOSIS — I10 Essential (primary) hypertension: Secondary | ICD-10-CM | POA: Diagnosis not present

## 2017-07-06 DIAGNOSIS — Z7982 Long term (current) use of aspirin: Secondary | ICD-10-CM | POA: Diagnosis not present

## 2017-07-06 DIAGNOSIS — F17210 Nicotine dependence, cigarettes, uncomplicated: Secondary | ICD-10-CM | POA: Diagnosis not present

## 2017-07-06 DIAGNOSIS — Z79899 Other long term (current) drug therapy: Secondary | ICD-10-CM | POA: Insufficient documentation

## 2017-07-06 LAB — COMPREHENSIVE METABOLIC PANEL
ALBUMIN: 4.5 g/dL (ref 3.5–5.0)
ALT: 15 U/L (ref 14–54)
ANION GAP: 10 (ref 5–15)
AST: 24 U/L (ref 15–41)
Alkaline Phosphatase: 80 U/L (ref 38–126)
BUN: 17 mg/dL (ref 6–20)
CHLORIDE: 102 mmol/L (ref 101–111)
CO2: 25 mmol/L (ref 22–32)
Calcium: 10 mg/dL (ref 8.9–10.3)
Creatinine, Ser: 0.71 mg/dL (ref 0.44–1.00)
GFR calc Af Amer: >60 mL/min (ref >60)
GFR calc non Af Amer: >60 mL/min (ref >60)
GLUCOSE: 98 mg/dL (ref 65–99)
POTASSIUM: 3.4 mmol/L — AB (ref 3.5–5.1)
SODIUM: 137 mmol/L (ref 135–145)
TOTAL PROTEIN: 8.4 g/dL — AB (ref 6.5–8.1)
Total Bilirubin: 0.6 mg/dL (ref 0.3–1.2)

## 2017-07-06 LAB — CBC
HEMATOCRIT: 39.9 % (ref 35.0–47.0)
HEMOGLOBIN: 13.3 g/dL (ref 12.0–16.0)
MCH: 30.5 pg (ref 26.0–34.0)
MCHC: 33.3 g/dL (ref 32.0–36.0)
MCV: 91.7 fL (ref 80.0–100.0)
Platelets: 360 10*3/uL (ref 150–440)
RBC: 4.35 MIL/uL (ref 3.80–5.20)
RDW: 14.2 % (ref 11.5–14.5)
WBC: 12.3 10*3/uL — ABNORMAL HIGH (ref 3.6–11.0)

## 2017-07-06 LAB — URINALYSIS, COMPLETE (UACMP) WITH MICROSCOPIC
BACTERIA UA: NONE SEEN
Bilirubin Urine: NEGATIVE
Glucose, UA: NEGATIVE mg/dL
Ketones, ur: NEGATIVE mg/dL
Leukocytes, UA: NEGATIVE
NITRITE: NEGATIVE
PROTEIN: NEGATIVE mg/dL
Specific Gravity, Urine: 1.027 (ref 1.005–1.030)
pH: 5 (ref 5.0–8.0)

## 2017-07-06 LAB — LIPASE, BLOOD: LIPASE: 19 U/L (ref 11–51)

## 2017-07-06 LAB — TROPONIN I

## 2017-07-06 MED ORDER — GI COCKTAIL ~~LOC~~
30.0000 mL | Freq: Once | ORAL | Status: AC
  Administered 2017-07-06: 30 mL via ORAL
  Filled 2017-07-06: qty 30

## 2017-07-06 NOTE — ED Provider Notes (Signed)
[facility] Emergency Department Provider Note ____________________________________________   I have reviewed the triage vital signs and the triage nursing note.  HISTORY  Chief Complaint Abdominal Pain   Historian Patient  HPI Carla [name] is a 56 y.o. female with a history of hypertension and prior MI, as well as abdominal hysterectomy, presents with several days of intermittent epigastric and upper abdominal pain.  It feels like burning.  It seems to be associated when she eats.  She has no known history of gallstones.  No fevers.  Mild burping and belching and nausea without vomiting or diarrhea.  No black or bloody stools.  No urinary symptoms.   Past Medical History:  Diagnosis Date  . Coronary artery disease   . Hypertension   . Myocardial infarct, old     There are no active problems to display for this patient.   Past Surgical History:  Procedure Laterality Date  . ABDOMINAL HYSTERECTOMY    . CARPAL TUNNEL RELEASE      Prior to Admission medications   Medication Sig Start Date End Date Taking? Authorizing Provider  aspirin EC 81 MG tablet Take 1 tablet by mouth daily. 01/21/14  Yes [provider]  hydrochlorothiazide (HYDRODIURIL) 12.5 MG tablet Take 1 tablet by mouth daily. 03/25/15  Yes [provider]  metoprolol (LOPRESSOR) 50 MG tablet Take 25 mg by mouth daily.    Yes [provider]  pantoprazole (PROTONIX) 40 MG tablet Take 1 tablet by mouth daily.   Yes [provider]  telmisartan (MICARDIS) 80 MG tablet Take 80 mg by mouth daily. 07/03/17  Yes [provider]  hydrOXYzine (ATARAX/VISTARIL) 25 MG tablet Take 1 tablet (25 mg total) by mouth 3 (three) times daily as needed. Patient not taking: Reported on 07/06/2017 04/22/17   Faythe GheeFisher, Susan W, PA-C  methylPREDNISolone (MEDROL DOSEPAK) 4 MG TBPK tablet Take 6 pills on day one then decrease by 1 pill each day Patient not taking: Reported on  07/06/2017 04/22/17   Faythe GheeFisher, Susan W, PA-C    No Known Allergies  History reviewed. No pertinent family history.  Social History Social History   Tobacco Use  . Smoking status: Current Every Day Smoker    Packs/day: 0.50    Types: Cigarettes  Substance Use Topics  . Alcohol use: No  . Drug use: No    Review of Systems  Constitutional: Negative for fever. Eyes: Negative for visual changes. ENT: Negative for sore throat. Cardiovascular: Negative for chest pain. Respiratory: Negative for shortness of breath. Gastrointestinal: GI symptom as per HPI. Genitourinary: Negative for dysuria. Musculoskeletal: Negative for back pain. Skin: Negative for rash. Neurological: Negative for headache.  ____________________________________________   PHYSICAL EXAM:  VITAL SIGNS: ED Triage Vitals  Enc Vitals Group     BP 07/06/17 0732 (!) 145/60     Pulse Rate 07/06/17 0732 89     Resp 07/06/17 0732 16     Temp 07/06/17 0732 97.8 F (36.6 C)     Temp Source 07/06/17 0732 Oral     SpO2 07/06/17 0732 100 %     Weight 07/06/17 0733 168 lb (76.2 kg)     Height 07/06/17 0733 5\' 3"  (1.6 m)     Head Circumference --      Peak Flow --      Pain Score 07/06/17 0732 1     Pain Loc --      Pain Edu? --      Excl. in GC? --  Constitutional: Alert and oriented. Well appearing and in no distress. HEENT   Head: Normocephalic and atraumatic.      Eyes: Conjunctivae are normal. Pupils equal and round.       Ears:         Nose: No congestion/rhinnorhea.   Mouth/Throat: Mucous membranes are moist.   Neck: No stridor. Cardiovascular/Chest: Normal rate, regular rhythm.  No murmurs, rubs, or gallops. Respiratory: Normal respiratory effort without tachypnea nor retractions. Breath sounds are clear and equal bilaterally. No wheezes/rales/rhonchi. Gastrointestinal: Soft. No distention, no guarding, no rebound.  Mild epigastric discomfort on palpation with some extension into the  right upper quadrant.  No lower abdominal tenderness to palpation. Genitourinary/rectal:Deferred Musculoskeletal: Nontender with normal range of motion in all extremities. No joint effusions.  No lower extremity tenderness.  No edema. Neurologic:  Normal speech and language. No gross or focal neurologic deficits are appreciated. Skin:  Skin is warm, dry and intact. No rash noted. Psychiatric: Mood and affect are normal. Speech and behavior are normal. Patient exhibits appropriate insight and judgment.   ____________________________________________  LABS (pertinent positives/negatives) I, Governor Rooks, MD the attending physician have reviewed the labs noted below.  Labs Reviewed  COMPREHENSIVE METABOLIC PANEL - Abnormal; Notable for the following components:      Result Value   Potassium 3.4 (*)    Total Protein 8.4 (*)    All other components within normal limits  CBC - Abnormal; Notable for the following components:   WBC 12.3 (*)    All other components within normal limits  URINALYSIS, COMPLETE (UACMP) WITH MICROSCOPIC - Abnormal; Notable for the following components:   Color, Urine YELLOW (*)    APPearance CLEAR (*)    Hgb urine dipstick SMALL (*)    Squamous Epithelial / LPF 0-5 (*)    All other components within normal limits  LIPASE, BLOOD  TROPONIN I  PREGNANCY, URINE    ____________________________________________    EKG I, Governor Rooks, MD, the attending physician have personally viewed and interpreted all ECGs.  73 bpm.  Normal sinus rhythm.  Narrow QS.  Normal axis.  Normal ST and T wave ____________________________________________  RADIOLOGY All Xrays were viewed by me.  Imaging interpreted by Radiologist, and I, Governor Rooks, MD the attending physician have reviewed the radiologist interpretation noted below.  Ultrasound right upper quadrant:  IMPRESSION: No abnormality seen in the right upper quadrant of the  abdomen. __________________________________________  PROCEDURES  Procedure(s) performed: None  Critical Care performed: None   ____________________________________________  ED COURSE / ASSESSMENT AND PLAN  Pertinent labs & imaging results that were available during my care of the patient were reviewed by me and considered in my medical decision making (see chart for details).    Symptoms seem most consistent with GERD or gastritis, but I decide to add on EKG as well as troponin to her evaluation.  I am consented for right upper quadrant ultrasound.  There is no lower abdominal tenderness and not suspicious for diverticulitis or appendicitis, we discussed I am not recommending CT at this point time.  ruq ultrasound is reassuring.  Patient reports complete relief after GI cocktail.  Will discharge with presumptive diagnosis GERD/gastritis and recommend symptomatic treatment.  DIFFERENTIAL DIAGNOSIS: Differential diagnosis includes, but is not limited to, biliary disease (biliary colic, acute cholecystitis, cholangitis, choledocholithiasis, etc), intrathoracic causes for epigastric abdominal pain including ACS, gastritis, duodenitis, pancreatitis, small bowel or large bowel obstruction, abdominal aortic aneurysm, hernia, and gastritis.  CONSULTATIONS: None   Patient /  Family / Caregiver informed of clinical course, medical decision-making process, and agree with plan.   I discussed return precautions, follow-up instructions, and discharge instructions with patient and/or family.  Discharge Instructions : You are evaluated for upper abdominal pain, and as we discussed her exam and I am most suspicious of GERD or gastritis, stomach acid.  Please take over-the-counter Maalox, use as directed on labeling for neutralization of acid and immediate symptoms.  Please take over-the-counter Prilosec, use as directed on labeling for 7-14 days to help reduce acid while stomach lining is  healing.  Avoid fatty, spicy, acidic such as citrus or juices, sodas, and caffeine foods and drinks until you are better, at least 1-2 weeks.  Return to the emergency room immediately for any new or worsening condition including uncontrolled pain, vomiting blood, black or bloody stools, fever, or any other symptoms concerning to you.    ___________________________________________   FINAL CLINICAL IMPRESSION(S) / ED DIAGNOSES   Final diagnoses:  Acute epigastric pain  Acute gastritis without hemorrhage, unspecified gastritis type      ___________________________________________        Note: This dictation was prepared with Dragon dictation. Any transcriptional errors that result from this process are unintentional    Governor RooksLord, Venda Dice, MD 07/06/17 1128

## 2017-07-06 NOTE — Discharge Instructions (Signed)
You are evaluated for upper abdominal pain, and as we discussed her exam and I am most suspicious of GERD or gastritis, stomach acid.  Please take over-the-counter Maalox, use as directed on labeling for neutralization of acid and immediate symptoms.  Please take over-the-counter Prilosec, use as directed on labeling for 7-14 days to help reduce acid while stomach lining is healing.  Avoid fatty, spicy, acidic such as citrus or juices, sodas, and caffeine foods and drinks until you are better, at least 1-2 weeks.  Return to the emergency room immediately for any new or worsening condition including uncontrolled pain, vomiting blood, black or bloody stools, fever, or any other symptoms concerning to you.

## 2017-07-06 NOTE — ED Notes (Signed)

## 2017-07-06 NOTE — ED Triage Notes (Signed)
Pt c/o lower abdominal cramping after drinking a detox type drink few days ago. No NVD. No fevers. "it feels like period cramps"

## 2017-10-04 ENCOUNTER — Emergency Department
Admission: EM | Admit: 2017-10-04 | Discharge: 2017-10-04 | Disposition: A | Discharge status: Home / Self Care | Payer: BLUE CROSS/BLUE SHIELD | Attending: Emergency Medicine | Admitting: Emergency Medicine

## 2017-10-04 ENCOUNTER — Other Ambulatory Visit: Payer: Self-pay

## 2017-10-04 ENCOUNTER — Encounter: Payer: Self-pay | Admitting: Emergency Medicine

## 2017-10-04 DIAGNOSIS — I10 Essential (primary) hypertension: Secondary | ICD-10-CM | POA: Diagnosis not present

## 2017-10-04 DIAGNOSIS — K219 Gastro-esophageal reflux disease without esophagitis: Secondary | ICD-10-CM | POA: Insufficient documentation

## 2017-10-04 DIAGNOSIS — I2510 Atherosclerotic heart disease of native coronary artery without angina pectoris: Secondary | ICD-10-CM | POA: Diagnosis not present

## 2017-10-04 DIAGNOSIS — R1013 Epigastric pain: Secondary | ICD-10-CM | POA: Diagnosis present

## 2017-10-04 DIAGNOSIS — F17210 Nicotine dependence, cigarettes, uncomplicated: Secondary | ICD-10-CM | POA: Diagnosis not present

## 2017-10-04 LAB — COMPREHENSIVE METABOLIC PANEL
ALK PHOS: 69 U/L (ref 38–126)
ALT: 15 U/L (ref 14–54)
AST: 23 U/L (ref 15–41)
Albumin: 4.2 g/dL (ref 3.5–5.0)
Anion gap: 9 (ref 5–15)
BUN: 14 mg/dL (ref 6–20)
CALCIUM: 9.9 mg/dL (ref 8.9–10.3)
CHLORIDE: 107 mmol/L (ref 101–111)
CO2: 24 mmol/L (ref 22–32)
CREATININE: 0.61 mg/dL (ref 0.44–1.00)
Glucose, Bld: 107 mg/dL — ABNORMAL HIGH (ref 65–99)
Potassium: 3.6 mmol/L (ref 3.5–5.1)
Sodium: 140 mmol/L (ref 135–145)
Total Bilirubin: 0.4 mg/dL (ref 0.3–1.2)
Total Protein: 8.1 g/dL (ref 6.5–8.1)

## 2017-10-04 LAB — CBC
HCT: 37.5 % (ref 35.0–47.0)
Hemoglobin: 12.9 g/dL (ref 12.0–16.0)
MCH: 30.9 pg (ref 26.0–34.0)
MCHC: 34.4 g/dL (ref 32.0–36.0)
MCV: 90 fL (ref 80.0–100.0)
PLATELETS: 390 10*3/uL (ref 150–440)
RBC: 4.17 MIL/uL (ref 3.80–5.20)
RDW: 14.1 % (ref 11.5–14.5)
WBC: 12.6 10*3/uL — AB (ref 3.6–11.0)

## 2017-10-04 LAB — LIPASE, BLOOD: LIPASE: 25 U/L (ref 11–51)

## 2017-10-04 LAB — TROPONIN I

## 2017-10-04 MED ORDER — LIDOCAINE VISCOUS 2 % MT SOLN: 20.0000 mL | OROMUCOSAL | 0 refills | Status: DC | PRN

## 2017-10-04 MED ORDER — GI COCKTAIL ~~LOC~~
30.0000 mL | Freq: Once | ORAL | Status: AC
  Administered 2017-10-04: 30 mL via ORAL

## 2017-10-04 MED ORDER — SUCRALFATE 1 G PO TABS: 1.0000 g | ORAL_TABLET | Freq: Four times a day (QID) | ORAL | 1 refills | Status: DC

## 2017-10-04 NOTE — ED Triage Notes (Addendum)
Patient ambulatory to triage with steady gait, without difficulty or distress noted; pt reports "heartburn"' since Monday; c/o discomfort to upper abd; denies accomp symptoms

## 2017-10-04 NOTE — ED Provider Notes (Signed)
[facility] Emergency Department Provider Note       Time seen: ----------------------------------------- 7:31 AM on 10/04/2017 -----------------------------------------   I have reviewed the triage vital signs and the nursing notes.  HISTORY   Chief Complaint Abdominal Pain    HPI Carla [name] is a 57 y.o. female with a history of coronary artery disease, hypertension and MI who presents to the ED for heartburn and epigastric discomfort since Monday.  She denies any accompanying symptoms.  Pain is 3 out of 10 in the epigastrium, nothing makes it better.  She reportedly takes antacids at this time but cannot remember the name of it.  Past Medical History:  Diagnosis Date  . Coronary artery disease   . Hypertension   . Myocardial infarct, old     There are no active problems to display for this patient.   Past Surgical History:  Procedure Laterality Date  . ABDOMINAL HYSTERECTOMY    . CARPAL TUNNEL RELEASE      Allergies Patient has no known allergies.  Social History Social History   Tobacco Use  . Smoking status: Current Every Day Smoker    Packs/day: 0.50    Types: Cigarettes  . Smokeless tobacco: Never Used  Substance Use Topics  . Alcohol use: No  . Drug use: No    Review of Systems Constitutional: Negative for fever. Cardiovascular: Negative for chest pain. Respiratory: Negative for shortness of breath. Gastrointestinal: Positive for abdominal pain Genitourinary: Negative for dysuria. Musculoskeletal: Negative for back pain. Skin: Negative for rash. Neurological: Negative for headaches, focal weakness or numbness.  All systems negative/normal/unremarkable except as stated in the HPI  ____________________________________________   PHYSICAL EXAM:  VITAL SIGNS: ED Triage Vitals  Enc Vitals Group     BP 10/04/17 0617 (!) 146/75     Pulse Rate 10/04/17 0617 79     Resp 10/04/17 0617 18     Temp 10/04/17 0617 98 F  (36.7 C)     Temp Source 10/04/17 0617 Oral     SpO2 10/04/17 0617 99 %     Weight 10/04/17 0615 176 lb (79.8 kg)     Height 10/04/17 0615 5\' 3"  (1.6 m)     Head Circumference --      Peak Flow --      Pain Score 10/04/17 0615 3     Pain Loc --      Pain Edu? --      Excl. in GC? --    Constitutional: Alert and oriented. Well appearing and in no distress. Eyes: Conjunctivae are normal. Normal extraocular movements. ENT   Head: Normocephalic and atraumatic.   Nose: No congestion/rhinnorhea.   Mouth/Throat: Mucous membranes are moist.   Neck: No stridor. Cardiovascular: Normal rate, regular rhythm. No murmurs, rubs, or gallops. Respiratory: Normal respiratory effort without tachypnea nor retractions. Breath sounds are clear and equal bilaterally. No wheezes/rales/rhonchi. Gastrointestinal: Soft and nontender. Normal bowel sounds Musculoskeletal: Nontender with normal range of motion in extremities. No lower extremity tenderness nor edema. Neurologic:  Normal speech and language. No gross focal neurologic deficits are appreciated.  Skin:  Skin is warm, dry and intact. No rash noted. Psychiatric: Mood and affect are normal. Speech and behavior are normal.  ____________________________________________  EKG: Interpreted by me. Sinus rhythm rate 74 bpm, normal PR interval, normal QRS, normal QT.  ____________________________________________  ED COURSE:  As part of my medical decision making, I reviewed the following data within the electronic MEDICAL RECORD NUMBER History obtained  from family if available, nursing notes, old chart and ekg, as well as notes from prior ED visits. Patient presented for abdominal pain, we will assess with labs and imaging as indicated at this time.   Procedures ____________________________________________   LABS (pertinent positives/negatives)  Labs Reviewed  COMPREHENSIVE METABOLIC PANEL - Abnormal; Notable for the following components:       Result Value   Glucose, Bld 107 (*)    All other components within normal limits  CBC - Abnormal; Notable for the following components:   WBC 12.6 (*)    All other components within normal limits  LIPASE, BLOOD  TROPONIN I   ____________________________________________  DIFFERENTIAL DIAGNOSIS   GERD, gastritis, peptic ulcer disease  FINAL ASSESSMENT AND PLAN  GERD   Plan: The patient had presented for GERD and epigastric discomfort. Patient's labs were unremarkable except for mild leukocytosis.  She was given a GI cocktail and is otherwise cleared for outpatient follow-up.   Ulice DashJohnathan E Williams, MD   Note: This note was generated in part or whole with voice recognition software. Voice recognition is usually quite accurate but there are transcription errors that can and very often do occur. I apologize for any typographical errors that were not detected and corrected.     Emily FilbertWilliams, Jonathan E, MD 10/04/17 [phone]

## 2017-10-04 NOTE — ED Notes (Signed)
Patient states she is in a hurry and refusing to have discharge vitals taken. Reports no pain at this time. Patient verbalized understanding of discharge instructions and follow-up care. Ambulatory to lobby with NAD noted.

## 2019-02-02 ENCOUNTER — Encounter: Payer: Self-pay | Admitting: Emergency Medicine

## 2019-02-02 ENCOUNTER — Emergency Department
Admission: EM | Admit: 2019-02-02 | Discharge: 2019-02-02 | Disposition: A | Discharge status: Home / Self Care | Payer: BC Managed Care – PPO | Attending: Emergency Medicine | Admitting: Emergency Medicine

## 2019-02-02 ENCOUNTER — Other Ambulatory Visit: Payer: Self-pay

## 2019-02-02 DIAGNOSIS — I10 Essential (primary) hypertension: Secondary | ICD-10-CM | POA: Insufficient documentation

## 2019-02-02 DIAGNOSIS — L232 Allergic contact dermatitis due to cosmetics: Secondary | ICD-10-CM | POA: Insufficient documentation

## 2019-02-02 DIAGNOSIS — F17210 Nicotine dependence, cigarettes, uncomplicated: Secondary | ICD-10-CM | POA: Insufficient documentation

## 2019-02-02 DIAGNOSIS — I2510 Atherosclerotic heart disease of native coronary artery without angina pectoris: Secondary | ICD-10-CM | POA: Insufficient documentation

## 2019-02-02 DIAGNOSIS — Z79899 Other long term (current) drug therapy: Secondary | ICD-10-CM | POA: Insufficient documentation

## 2019-02-02 DIAGNOSIS — Z7982 Long term (current) use of aspirin: Secondary | ICD-10-CM | POA: Diagnosis not present

## 2019-02-02 DIAGNOSIS — I252 Old myocardial infarction: Secondary | ICD-10-CM | POA: Insufficient documentation

## 2019-02-02 DIAGNOSIS — R21 Rash and other nonspecific skin eruption: Secondary | ICD-10-CM | POA: Diagnosis present

## 2019-02-02 MED ORDER — FAMOTIDINE 20 MG PO TABS: 20.0000 mg | ORAL_TABLET | Freq: Two times a day (BID) | ORAL | 0 refills | Status: DC

## 2019-02-02 MED ORDER — HYDROCORTISONE 2.5 % EX CREA: TOPICAL_CREAM | Freq: Two times a day (BID) | CUTANEOUS | 0 refills | Status: DC

## 2019-02-02 NOTE — ED Provider Notes (Signed)
[facility] Emergency Department Provider Note  ____________________________________________  Time seen: Approximately 11:45 AM  I have reviewed the triage vital signs and the nursing notes.   HISTORY  Chief Complaint Rash   HPI Carla [name] is a 58 y.o. female who presents to the emergency department for treatment and evaluation of rash to the arms that started a couple of days ago after using new soap.   No relief with Benadryl.  Past Medical History:  Diagnosis Date  . Coronary artery disease   . Hypertension   . Myocardial infarct, old     There are no active problems to display for this patient.   Past Surgical History:  Procedure Laterality Date  . ABDOMINAL HYSTERECTOMY    . CARPAL TUNNEL RELEASE      Prior to Admission medications   Medication Sig Start Date End Date Taking? Authorizing Provider  aspirin EC 81 MG tablet Take 1 tablet by mouth daily. 01/21/14   [provider]  famotidine (PEPCID) 20 MG tablet Take 1 tablet (20 mg total) by mouth 2 (two) times daily for 7 days. 02/02/19 02/09/19  Dvonte Gatliff, Johnette Abraham B, FNP  hydrochlorothiazide (HYDRODIURIL) 12.5 MG tablet Take 1 tablet by mouth daily. 03/25/15   [provider]  hydrocortisone 2.5 % cream Apply topically 2 (two) times daily. 02/02/19   Montravious Weigelt, Johnette Abraham B, FNP  hydrOXYzine (ATARAX/VISTARIL) 25 MG tablet Take 1 tablet (25 mg total) by mouth 3 (three) times daily as needed. Patient not taking: Reported on 07/06/2017 04/22/17   Versie Starks, PA-C  lidocaine (XYLOCAINE) 2 % solution Use as directed 20 mLs in the mouth or throat as needed for mouth pain. 10/04/17   Earleen Newport, MD  methylPREDNISolone (MEDROL DOSEPAK) 4 MG TBPK tablet Take 6 pills on day one then decrease by 1 pill each day Patient not taking: Reported on 07/06/2017 04/22/17   Versie Starks, PA-C  metoprolol tartrate (LOPRESSOR) 25 MG tablet Take 25 mg by mouth daily.     [provider]   pantoprazole (PROTONIX) 40 MG tablet Take 1 tablet by mouth daily.    [provider]  sucralfate (CARAFATE) 1 g tablet Take 1 tablet (1 g total) by mouth 4 (four) times daily. 10/04/17 10/04/18  Earleen Newport, MD  telmisartan (MICARDIS) 80 MG tablet Take 80 mg by mouth daily. 07/03/17   [provider]    Allergies Patient has no known allergies.  No family history on file.  Social History Social History   Tobacco Use  . Smoking status: Current Every Day Smoker    Packs/day: 0.50    Types: Cigarettes  . Smokeless tobacco: Never Used  Substance Use Topics  . Alcohol use: No  . Drug use: No    Review of Systems  Constitutional: Negative for fever. Respiratory: Negative for cough or shortness of breath.  Musculoskeletal: Negative for myalgias Skin: Positive for rash on the chest and arms Neurological: Negative for numbness or paresthesias. ____________________________________________   PHYSICAL EXAM:  VITAL SIGNS: ED Triage Vitals [02/02/19 1124]  Enc Vitals Group     BP (!) 121/57     Pulse Rate 66     Resp 18     Temp 98.7 F (37.1 C)     Temp Source Oral     SpO2 100 %     Weight 189 lb (85.7 kg)     Height 5\' 3"  (1.6 m)     Head Circumference  Peak Flow      Pain Score 0     Pain Loc      Pain Edu?      Excl. in GC?      Constitutional: Well appearing. Eyes: Conjunctivae are clear without discharge or drainage. Nose: No rhinorrhea noted. Mouth/Throat: Airway is patent.  Neck: No stridor. Unrestricted range of motion observed. Cardiovascular: Capillary refill is <3 seconds.  Respiratory: Respirations are even and unlabored.. Musculoskeletal: Unrestricted range of motion observed. Neurologic: Awake, alert, and oriented x 4.  Skin: Erythematous, maculopapular rash on the chest wall, upper back, and upper extremities  ____________________________________________   LABS (all labs ordered are listed, but only abnormal  results are displayed)  Labs Reviewed - No data to display ____________________________________________  EKG  Not indicated. ____________________________________________  RADIOLOGY  Not indicated ____________________________________________   PROCEDURES  Procedures ____________________________________________   INITIAL IMPRESSION / ASSESSMENT AND PLAN / ED COURSE  Carla [name] is a 58 y.o. female who presents to the emergency department for treatment and evaluation of pruritic rash on her chest wall and arms.  She was encouraged to continue using her Benadryl but will also be prescribed Pepcid and hydrocortisone cream.  She was encouraged to follow-up with her primary care provider or return to the emergency department for symptoms change or worsen.   Medications - No data to display   Pertinent labs & imaging results that were available during my care of the patient were reviewed by me and considered in my medical decision making (see chart for details).  ____________________________________________   FINAL CLINICAL IMPRESSION(S) / ED DIAGNOSES  Final diagnoses:  Allergic contact dermatitis due to cosmetics    ED Discharge Orders         Ordered    famotidine (PEPCID) 20 MG tablet  2 times daily     02/02/19 1144    hydrocortisone 2.5 % cream  2 times daily     02/02/19 1144           Note:  This document was prepared using Dragon voice recognition software and may include unintentional dictation errors.   Chinita Pesterriplett, Haeli Gerlich B, FNP 02/02/19 1614    Sharman CheekStafford, Phillip, MD 02/03/19 [phone]

## 2019-02-02 NOTE — ED Triage Notes (Signed)
Rash chest and arms x 2 days. Itchy. Denies new meds. Has been using new soap.

## 2019-08-09 ENCOUNTER — Emergency Department: Payer: BC Managed Care – PPO

## 2019-08-09 ENCOUNTER — Other Ambulatory Visit: Payer: Self-pay

## 2019-08-09 ENCOUNTER — Emergency Department
Admission: EM | Admit: 2019-08-09 | Discharge: 2019-08-09 | Disposition: A | Discharge status: Home / Self Care | Payer: BC Managed Care – PPO | Attending: Emergency Medicine | Admitting: Emergency Medicine

## 2019-08-09 DIAGNOSIS — Y929 Unspecified place or not applicable: Secondary | ICD-10-CM | POA: Diagnosis not present

## 2019-08-09 DIAGNOSIS — Y9389 Activity, other specified: Secondary | ICD-10-CM | POA: Diagnosis not present

## 2019-08-09 DIAGNOSIS — S82892A Other fracture of left lower leg, initial encounter for closed fracture: Secondary | ICD-10-CM

## 2019-08-09 DIAGNOSIS — X58XXXA Exposure to other specified factors, initial encounter: Secondary | ICD-10-CM | POA: Insufficient documentation

## 2019-08-09 DIAGNOSIS — I119 Hypertensive heart disease without heart failure: Secondary | ICD-10-CM | POA: Insufficient documentation

## 2019-08-09 DIAGNOSIS — S99912A Unspecified injury of left ankle, initial encounter: Secondary | ICD-10-CM | POA: Diagnosis present

## 2019-08-09 DIAGNOSIS — S8255XA Nondisplaced fracture of medial malleolus of left tibia, initial encounter for closed fracture: Secondary | ICD-10-CM | POA: Insufficient documentation

## 2019-08-09 DIAGNOSIS — I2510 Atherosclerotic heart disease of native coronary artery without angina pectoris: Secondary | ICD-10-CM | POA: Insufficient documentation

## 2019-08-09 DIAGNOSIS — Y999 Unspecified external cause status: Secondary | ICD-10-CM | POA: Insufficient documentation

## 2019-08-09 DIAGNOSIS — F17210 Nicotine dependence, cigarettes, uncomplicated: Secondary | ICD-10-CM | POA: Insufficient documentation

## 2019-08-09 MED ORDER — TRAMADOL HCL 50 MG PO TABS: 50.0000 mg | ORAL_TABLET | Freq: Four times a day (QID) | ORAL | 0 refills | Status: AC | PRN

## 2019-08-09 MED ORDER — NAPROXEN 500 MG PO TABS: 500.0000 mg | ORAL_TABLET | Freq: Two times a day (BID) | ORAL | Status: DC

## 2019-08-09 NOTE — Discharge Instructions (Signed)
Wear splint and take medication as directed.  Call orthopedic clinic today to schedule an appointment.

## 2019-08-09 NOTE — ED Triage Notes (Signed)
Pt in with co left ankle pain x 3 days, no injury. Pt is ambulatory, took ibuprofen with temporary relief.

## 2019-08-09 NOTE — ED Notes (Signed)
See triage note  Presents with pain to lateral left ankle   States pain started couple of days ago w/o injury  No swelling   Good pulses

## 2019-08-09 NOTE — ED Provider Notes (Addendum)
[facility] Emergency Department Provider Note   ____________________________________________   None    (approximate)  I have reviewed the triage vital signs and the nursing notes.   HISTORY  Chief Complaint Ankle Pain    HPI Carla [name] is a 59 y.o. female patient complain nontraumatic ankle pain for 3 days.  Patient state able to ambulate with mild discomfort.  Patient rates the pain as a 3/10.  Patient described pain as "achy".  No palliative measure for complaint.         Past Medical History:  Diagnosis Date  . Coronary artery disease   . Hypertension   . Myocardial infarct, old     There are no problems to display for this patient.   Past Surgical History:  Procedure Laterality Date  . ABDOMINAL HYSTERECTOMY    . CARPAL TUNNEL RELEASE      Prior to Admission medications   Medication Sig Start Date End Date Taking? Authorizing Provider  aspirin EC 81 MG tablet Take 1 tablet by mouth daily. 01/21/14   [provider]  famotidine (PEPCID) 20 MG tablet Take 1 tablet (20 mg total) by mouth 2 (two) times daily for 7 days. 02/02/19 02/09/19  Triplett, Johnette Abraham B, FNP  hydrochlorothiazide (HYDRODIURIL) 12.5 MG tablet Take 1 tablet by mouth daily. 03/25/15   [provider]  hydrocortisone 2.5 % cream Apply topically 2 (two) times daily. 02/02/19   Triplett, Johnette Abraham B, FNP  metoprolol tartrate (LOPRESSOR) 25 MG tablet Take 25 mg by mouth daily.     [provider]  naproxen (NAPROSYN) 500 MG tablet Take 1 tablet (500 mg total) by mouth 2 (two) times daily with a meal. 08/09/19   Sable Feil, PA-C  pantoprazole (PROTONIX) 40 MG tablet Take 1 tablet by mouth daily.    [provider]  sucralfate (CARAFATE) 1 g tablet Take 1 tablet (1 g total) by mouth 4 (four) times daily. 10/04/17 10/04/18  Earleen Newport, MD  telmisartan (MICARDIS) 80 MG tablet Take 80 mg by mouth daily. 07/03/17   [provider]    traMADol (ULTRAM) 50 MG tablet Take 1 tablet (50 mg total) by mouth every 6 (six) hours as needed. 08/09/19 08/08/20  Sable Feil, PA-C    Allergies Patient has no known allergies.  No family history on file.  Social History Social History   Tobacco Use  . Smoking status: Current Every Day Smoker    Packs/day: 0.50    Types: Cigarettes  . Smokeless tobacco: Never Used  Substance Use Topics  . Alcohol use: No  . Drug use: No    Review of Systems  Constitutional: No fever/chills Eyes: No visual changes. ENT: No sore throat. Cardiovascular: Denies chest pain. Respiratory: Denies shortness of breath. Gastrointestinal: No abdominal pain.  No nausea, no vomiting.  No diarrhea.  No constipation. Genitourinary: Negative for dysuria. Musculoskeletal: Left ankle pain.   Skin: Negative for rash. Neurological: Negative for headaches, focal weakness or numbness. Endocrine:  Hypertension  ____________________________________________   PHYSICAL EXAM:  VITAL SIGNS: ED Triage Vitals  Enc Vitals Group     BP 08/09/19 0619 137/79     Pulse Rate 08/09/19 0619 90     Resp 08/09/19 0619 18     Temp 08/09/19 0619 98.7 F (37.1 C)     Temp Source 08/09/19 0619 Oral     SpO2 08/09/19 0619 100 %     Weight 08/09/19 0622 170 lb (77.1 kg)  Height 08/09/19 0620 5\' 3"  (1.6 m)     Head Circumference --      Peak Flow --      Pain Score 08/09/19 0620 3     Pain Loc --      Pain Edu? --      Excl. in GC? --    Constitutional: Alert and oriented. Well appearing and in no acute distress. Cardiovascular: Normal rate, regular rhythm. Grossly normal heart sounds.  Good peripheral circulation. Respiratory: Normal respiratory effort.  No retractions. Lungs CTAB. Musculoskeletal: No obvious deformity to the left ankle.  Patient is mild guarding palpation of the medial malleolus.   Neurologic:  Normal speech and language. No gross focal neurologic deficits are appreciated. No gait  instability. Skin:  Skin is warm, dry and intact. No rash noted.  No abrasion or ecchymosis. Psychiatric: Mood and affect are normal. Speech and behavior are normal.  ____________________________________________   LABS (all labs ordered are listed, but only abnormal results are displayed)  Labs Reviewed - No data to display ____________________________________________  EKG   ____________________________________________  RADIOLOGY  ED MD interpretation:    Official radiology report(s): DG Ankle Complete Left  Result Date: 08/09/2019 CLINICAL DATA:  Left ankle pain.  No injury. EXAM: LEFT ANKLE COMPLETE - 3+ VIEW COMPARISON:  Prior report of [phone]. FINDINGS: Very subtle lucencies noted over the distal tip of the medial malleolus. Very subtle nondisplaced fractures cannot be excluded. No other focal abnormality identified. No radiopaque foreign body IMPRESSION: Very subtle lucencies of the distal tip of the medial malleolus. Very subtle nondisplaced fractures cannot be excluded. No other focal abnormality identified. Electronically Signed   By: [id number]  Register   On: 08/09/2019 07:19    ____________________________________________   PROCEDURES  Procedure(s) performed (including Critical Care):  .Splint Application  Date/Time: 08/09/2019 8:19 AM Performed by: McLamb, 08/11/2019, RN Authorized by: Jerrel Ivory, PA-C   Consent:    Consent obtained:  Verbal   Consent given by:  Patient   Risks discussed:  Numbness, pain and swelling Pre-procedure details:    Sensation:  Normal Procedure details:    Laterality:  Left   Location:  Ankle   Ankle:  L ankle   Strapping: no     Splint type:  Ankle stirrup Post-procedure details:    Pain:  Unchanged   Sensation:  Normal   Patient tolerance of procedure:  Tolerated well, no immediate complications     ____________________________________________   INITIAL IMPRESSION / ASSESSMENT AND PLAN / ED COURSE  As part of my  medical decision making, I reviewed the following data within the electronic MEDICAL RECORD NUMBER     Patient presents with 3 days of nontraumatic ankle pain.  Discussed x-ray findings with patient.  Patient placed in ankle splint and given discharge care instructions.  Patient advised follow orthopedic for definitive evaluation and treatment.    Carla [name] was evaluated in Emergency Department on 08/09/2019 for the symptoms described in the history of present illness. She was evaluated in the context of the global COVID-19 pandemic, which necessitated consideration that the patient might be at risk for infection with the SARS-CoV-2 virus that causes COVID-19. Institutional protocols and algorithms that pertain to the evaluation of patients at risk for COVID-19 are in a state of rapid change based on information released by regulatory bodies including the CDC and federal and state organizations. These policies and algorithms were followed during the patient's care in the ED.  ____________________________________________   FINAL CLINICAL IMPRESSION(S) / ED DIAGNOSES  Final diagnoses:  Ankle fracture, left, closed, initial encounter     ED Discharge Orders         Ordered    naproxen (NAPROSYN) 500 MG tablet  2 times daily with meals     08/09/19 0731    traMADol (ULTRAM) 50 MG tablet  Every 6 hours PRN     08/09/19 0731           Note:  This document was prepared using Dragon voice recognition software and may include unintentional dictation errors.    Joni Reining, PA-C 08/09/19 0735    Joni Reining, PA-C 08/09/19 Darrel Hoover    Sharman Cheek, MD 08/09/19 [phone]

## 2019-12-27 ENCOUNTER — Other Ambulatory Visit: Payer: Self-pay | Admitting: Internal Medicine

## 2019-12-27 DIAGNOSIS — T466X5A Adverse effect of antihyperlipidemic and antiarteriosclerotic drugs, initial encounter: Secondary | ICD-10-CM | POA: Insufficient documentation

## 2019-12-27 DIAGNOSIS — G720 Drug-induced myopathy: Secondary | ICD-10-CM | POA: Insufficient documentation

## 2019-12-27 DIAGNOSIS — Z1231 Encounter for screening mammogram for malignant neoplasm of breast: Secondary | ICD-10-CM

## 2020-10-28 ENCOUNTER — Encounter: Payer: Self-pay | Admitting: Emergency Medicine

## 2020-10-28 ENCOUNTER — Emergency Department
Admission: EM | Admit: 2020-10-28 | Discharge: 2020-10-28 | Disposition: A | Discharge status: Home / Self Care | Payer: BC Managed Care – PPO | Attending: Emergency Medicine | Admitting: Emergency Medicine

## 2020-10-28 ENCOUNTER — Other Ambulatory Visit: Payer: Self-pay

## 2020-10-28 DIAGNOSIS — F17210 Nicotine dependence, cigarettes, uncomplicated: Secondary | ICD-10-CM | POA: Diagnosis not present

## 2020-10-28 DIAGNOSIS — I2510 Atherosclerotic heart disease of native coronary artery without angina pectoris: Secondary | ICD-10-CM | POA: Diagnosis not present

## 2020-10-28 DIAGNOSIS — I10 Essential (primary) hypertension: Secondary | ICD-10-CM | POA: Diagnosis not present

## 2020-10-28 DIAGNOSIS — Z7982 Long term (current) use of aspirin: Secondary | ICD-10-CM | POA: Insufficient documentation

## 2020-10-28 DIAGNOSIS — R519 Headache, unspecified: Secondary | ICD-10-CM | POA: Diagnosis present

## 2020-10-28 DIAGNOSIS — Z79899 Other long term (current) drug therapy: Secondary | ICD-10-CM | POA: Insufficient documentation

## 2020-10-28 LAB — CBC
HCT: 41 % (ref 36.0–46.0)
Hemoglobin: 13.6 g/dL (ref 12.0–15.0)
MCH: 29.6 pg (ref 26.0–34.0)
MCHC: 33.2 g/dL (ref 30.0–36.0)
MCV: 89.1 fL (ref 80.0–100.0)
Platelets: 398 10*3/uL (ref 150–400)
RBC: 4.6 MIL/uL (ref 3.87–5.11)
RDW: 14.5 % (ref 11.5–15.5)
WBC: 12.9 10*3/uL — ABNORMAL HIGH (ref 4.0–10.5)
nRBC: 0 % (ref 0.0–0.2)

## 2020-10-28 LAB — BASIC METABOLIC PANEL
Anion gap: 9 (ref 5–15)
BUN: 10 mg/dL (ref 6–20)
CO2: 23 mmol/L (ref 22–32)
Calcium: 9.3 mg/dL (ref 8.9–10.3)
Chloride: 108 mmol/L (ref 98–111)
Creatinine, Ser: 0.67 mg/dL (ref 0.44–1.00)
GFR, Estimated: >60 mL/min (ref >60)
Glucose, Bld: 102 mg/dL — ABNORMAL HIGH (ref 70–99)
Potassium: 3.8 mmol/L (ref 3.5–5.1)
Sodium: 140 mmol/L (ref 135–145)

## 2020-10-28 MED ORDER — KETOROLAC TROMETHAMINE 30 MG/ML IJ SOLN
30.0000 mg | Freq: Once | INTRAMUSCULAR | Status: AC
  Administered 2020-10-28: 30 mg via INTRAVENOUS
  Filled 2020-10-28: qty 1

## 2020-10-28 MED ORDER — SODIUM CHLORIDE 0.9 % IV SOLN
1000.0000 mL | Freq: Once | INTRAVENOUS | Status: AC
  Administered 2020-10-28: 1000 mL via INTRAVENOUS

## 2020-10-28 NOTE — ED Triage Notes (Signed)
Pt comes into the ED via POV c/o headache and HTN.  Pt states she does take BP medication and has been taking it regularly.  Pt is neurologically intact at this time with even and unlabored respirations.  Pt ambulatory to triage at this time.  Pt states the headache has been ongoing since starting to take apple cider vinegar gummies.

## 2020-10-28 NOTE — ED Provider Notes (Signed)
[name] Emergency Department Provider Note   ____________________________________________    I have reviewed the triage vital signs and the nursing notes.   HISTORY  Chief Complaint Headache     HPI Carla [name] is a 60 y.o. female with a history of CAD, hypertension who presents with complaints of high blood pressure.  Patient reports she checked her blood pressure earlier today and it was elevated.  She did have a headache before but that is improved.  Denies neuro deficits.  No nausea vomiting or abdominal pain.  No chest pain.  No palpitation.  Reports compliance with her medications   Past Medical History:  Diagnosis Date  . Coronary artery disease   . Hypertension   . Myocardial infarct, old     There are no problems to display for this patient.   Past Surgical History:  Procedure Laterality Date  . ABDOMINAL HYSTERECTOMY    . CARPAL TUNNEL RELEASE      Prior to Admission medications   Medication Sig Start Date End Date Taking? Authorizing Provider  aspirin EC 81 MG tablet Take 1 tablet by mouth daily. 01/21/14   [provider]  famotidine (PEPCID) 20 MG tablet Take 1 tablet (20 mg total) by mouth 2 (two) times daily for 7 days. 02/02/19 02/09/19  Triplett, Rulon Eisenmenger B, FNP  hydrochlorothiazide (HYDRODIURIL) 12.5 MG tablet Take 1 tablet by mouth daily. 03/25/15   [provider]  hydrocortisone 2.5 % cream Apply topically 2 (two) times daily. 02/02/19   Triplett, Rulon Eisenmenger B, FNP  metoprolol tartrate (LOPRESSOR) 25 MG tablet Take 25 mg by mouth daily.     [provider]  naproxen (NAPROSYN) 500 MG tablet Take 1 tablet (500 mg total) by mouth 2 (two) times daily with a meal. 08/09/19   Joni Reining, PA-C  pantoprazole (PROTONIX) 40 MG tablet Take 1 tablet by mouth daily.    [provider]  sucralfate (CARAFATE) 1 g tablet Take 1 tablet (1 g total) by mouth 4 (four) times daily. 10/04/17 10/04/18  Emily Filbert, MD  telmisartan (MICARDIS) 80 MG tablet Take 80 mg by mouth daily. 07/03/17   [provider]     Allergies Patient has no known allergies.  History reviewed. No pertinent family history.  Social History Social History   Tobacco Use  . Smoking status: Current Every Day Smoker    Packs/day: 0.50    Types: Cigarettes  . Smokeless tobacco: Never Used  Substance Use Topics  . Alcohol use: No  . Drug use: No    Review of Systems  Constitutional: No fever/chills Eyes: No visual changes.  ENT: No sore throat. Cardiovascular: Denies chest pain. Respiratory: Denies shortness of breath. Gastrointestinal: No abdominal pain.   Genitourinary: Negative for dysuria. Musculoskeletal: Negative for back pain. Skin: Negative for rash. Neurological: Negative for headaches or weakness   ____________________________________________   PHYSICAL EXAM:  VITAL SIGNS: ED Triage Vitals  Enc Vitals Group     BP 10/28/20 1055 (!) 198/86     Pulse Rate 10/28/20 1055 (!) 133     Resp 10/28/20 1055 17     Temp 10/28/20 1055 99.8 F (37.7 C)     Temp Source 10/28/20 1055 Oral     SpO2 10/28/20 1055 100 %     Weight 10/28/20 1056 82.6 kg (182 lb)     Height 10/28/20 1056 1.6 m (5\' 3" )     Head Circumference --  Peak Flow --      Pain Score 10/28/20 1055 3     Pain Loc --      Pain Edu? --      Excl. in GC? --     Constitutional: Alert and oriented. No acute distress. Pleasant and interactive Eyes: Conjunctivae are normal.  Head: Atraumatic.  Cardiovascular: Normal rate, regular rhythm. Grossly normal heart sounds.  Good peripheral circulation. Respiratory: Normal respiratory effort.  No retractions. Lungs CTAB. Gastrointestinal: Soft and nontender. No distention.  No CVA tenderness.  Musculoskeletal: No lower extremity tenderness nor edema.  Warm and well perfused Neurologic:  Normal speech and language. No gross focal neurologic deficits are appreciated.   Skin:  Skin is warm, dry and intact. No rash noted. Psychiatric: Mood and affect are normal. Speech and behavior are normal.  ____________________________________________   LABS (all labs ordered are listed, but only abnormal results are displayed)  Labs Reviewed  CBC - Abnormal; Notable for the following components:      Result Value   WBC 12.9 (*)    All other components within normal limits  BASIC METABOLIC PANEL - Abnormal; Notable for the following components:   Glucose, Bld 102 (*)    All other components within normal limits   ____________________________________________  EKG  ED ECG REPORT I, [name], the attending physician, personally viewed and interpreted this ECG.  Date: 10/28/2020  Rhythm: normal sinus rhythm QRS Axis: normal Intervals: normal ST/T Wave abnormalities: normal Narrative Interpretation: no evidence of acute ischemia  ____________________________________________  RADIOLOGY  None ____________________________________________   PROCEDURES  Procedure(s) performed: No  Procedures   Critical Care performed: No ____________________________________________   INITIAL IMPRESSION / ASSESSMENT AND PLAN / ED COURSE  Pertinent labs & imaging results that were available during my care of the patient were reviewed by me and considered in my medical decision making (see chart for details).  Patient well-appearing and in no acute distress, vital signs here are reassuring, blood pressure is improved, patient treated with IV Toradol as well as IV fluids with significant improvement in symptoms, resolution of headache.  Appropriate for discharge with outpatient follow-up with PCP.    ____________________________________________   FINAL CLINICAL IMPRESSION(S) / ED DIAGNOSES  Final diagnoses:  Acute nonintractable headache, unspecified headache type        Note:  This document was prepared using Dragon voice recognition software and  may include unintentional dictation errors.   [name], MD 10/28/20 2020

## 2020-10-28 NOTE — ED Notes (Signed)
Redraw sent to the lab.

## 2020-12-21 ENCOUNTER — Other Ambulatory Visit: Payer: Self-pay | Admitting: Internal Medicine

## 2020-12-21 DIAGNOSIS — Z1231 Encounter for screening mammogram for malignant neoplasm of breast: Secondary | ICD-10-CM

## 2021-04-18 ENCOUNTER — Emergency Department
Admission: EM | Admit: 2021-04-18 | Discharge: 2021-04-18 | Disposition: A | Discharge status: Home / Self Care | Payer: BC Managed Care – PPO | Attending: Internal Medicine | Admitting: Internal Medicine

## 2021-04-18 ENCOUNTER — Other Ambulatory Visit: Payer: Self-pay

## 2021-04-18 DIAGNOSIS — I2510 Atherosclerotic heart disease of native coronary artery without angina pectoris: Secondary | ICD-10-CM | POA: Diagnosis not present

## 2021-04-18 DIAGNOSIS — F17210 Nicotine dependence, cigarettes, uncomplicated: Secondary | ICD-10-CM | POA: Insufficient documentation

## 2021-04-18 DIAGNOSIS — L243 Irritant contact dermatitis due to cosmetics: Secondary | ICD-10-CM | POA: Diagnosis not present

## 2021-04-18 DIAGNOSIS — Z7982 Long term (current) use of aspirin: Secondary | ICD-10-CM | POA: Insufficient documentation

## 2021-04-18 DIAGNOSIS — R21 Rash and other nonspecific skin eruption: Secondary | ICD-10-CM | POA: Diagnosis present

## 2021-04-18 DIAGNOSIS — Z79899 Other long term (current) drug therapy: Secondary | ICD-10-CM | POA: Insufficient documentation

## 2021-04-18 DIAGNOSIS — I10 Essential (primary) hypertension: Secondary | ICD-10-CM | POA: Insufficient documentation

## 2021-04-18 MED ORDER — PREDNISONE 10 MG PO TABS: 10.0000 mg | ORAL_TABLET | Freq: Every day | ORAL | 0 refills | Status: AC

## 2021-04-18 MED ORDER — TRIAMCINOLONE ACETONIDE 0.1 % EX CREA: 1.0000 "application " | TOPICAL_CREAM | Freq: Two times a day (BID) | CUTANEOUS | 0 refills | Status: DC

## 2021-04-18 NOTE — Discharge Instructions (Addendum)
You were seen today for a rash of your face and ear.  This is likely a contact dermatitis secondary to synthetic hair.  I am giving you oral prednisone daily for the next 3 days.  I am also given you a topical steroid to use twice daily as needed for itching.  Please wash your hands after use and avoid getting the steroid cream in your eye.  Follow-up with your PCP as needed.

## 2021-04-18 NOTE — ED Triage Notes (Addendum)
Pt come with c/o rash to face. Pt states she had braids placed month ago. Pt states she recently took them out and noticed the rash. Pt states itching

## 2021-04-18 NOTE — ED Provider Notes (Signed)
[facility] Emergency Department Provider Note ____________________________________________  Time seen: 0800  I have reviewed the triage vital signs and the nursing notes.  HISTORY  Chief Complaint  Rash   HPI Carla [name] is a 60 y.o. female presents to the ER today with complaint of a rash to her forehead and right ear.  She reports this started 2 weeks ago.  The rash itches intensely.  It has not seemed to spread.  She denies changes in make up, facial products or lotions.  She did get some braids with synthetic hair 1 month ago and thinks that she may be having allergic reaction to it.  No one in her home has a similar rash.  She has not tried anything OTC for this.  Of note, her blood pressure is elevated today at 198/86.  She reports history of HTN.  She reports she just got off work and did not take her blood pressure medication before she arrived.  Past Medical History:  Diagnosis Date   Coronary artery disease    Hypertension    Myocardial infarct, old     There are no problems to display for this patient.   Past Surgical History:  Procedure Laterality Date   ABDOMINAL HYSTERECTOMY     CARPAL TUNNEL RELEASE      Prior to Admission medications   Medication Sig Start Date End Date Taking? Authorizing Provider  predniSONE (DELTASONE) 10 MG tablet Take 1 tablet (10 mg total) by mouth daily for 3 days. 04/18/21 04/21/21 Yes Aijalon Kirtz, Salvadore Oxford, NP  triamcinolone cream (KENALOG) 0.1 % Apply 1 application topically 2 (two) times daily. 04/18/21  Yes Lorre Munroe, NP  aspirin EC 81 MG tablet Take 1 tablet by mouth daily. 01/21/14   [provider]  famotidine (PEPCID) 20 MG tablet Take 1 tablet (20 mg total) by mouth 2 (two) times daily for 7 days. 02/02/19 02/09/19  Triplett, Rulon Eisenmenger B, FNP  hydrochlorothiazide (HYDRODIURIL) 12.5 MG tablet Take 1 tablet by mouth daily. 03/25/15   [provider]  hydrocortisone 2.5 % cream Apply topically 2  (two) times daily. 02/02/19   Triplett, Rulon Eisenmenger B, FNP  metoprolol tartrate (LOPRESSOR) 25 MG tablet Take 25 mg by mouth daily.     [provider]  naproxen (NAPROSYN) 500 MG tablet Take 1 tablet (500 mg total) by mouth 2 (two) times daily with a meal. 08/09/19   Joni Reining, PA-C  pantoprazole (PROTONIX) 40 MG tablet Take 1 tablet by mouth daily.    [provider]  sucralfate (CARAFATE) 1 g tablet Take 1 tablet (1 g total) by mouth 4 (four) times daily. 10/04/17 10/04/18  Emily Filbert, MD  telmisartan (MICARDIS) 80 MG tablet Take 80 mg by mouth daily. 07/03/17   [provider]    Allergies Patient has no known allergies.  No family history on file.  Social History Social History   Tobacco Use   Smoking status: Every Day    Packs/day: 0.50    Types: Cigarettes   Smokeless tobacco: Never  Substance Use Topics   Alcohol use: No   Drug use: No    Review of Systems  Constitutional: Negative for fever, chills. Eyes: Negative for visual changes. Cardiovascular: Negative for chest pain or chest tightness. Respiratory: Negative for cough or shortness of breath. Skin: Positive for rash of scalp and right ear. Neurological: Negative for headaches, focal weakness, dizziness, tingling or numbness. ____________________________________________  PHYSICAL EXAM:  VITAL SIGNS: ED  Triage Vitals [04/18/21 0800]  Enc Vitals Group     BP (!) 198/86     Pulse Rate 81     Resp 18     Temp 98.7 F (37.1 C)     Temp src      SpO2 100 %     Weight      Height      Head Circumference      Peak Flow      Pain Score 0     Pain Loc      Pain Edu?      Excl. in GC?     Constitutional: Alert and oriented. Well appearing and in no distress. Head: Normocephalic. Eyes: Normal extraocular movements Cardiovascular: Normal rate, regular rhythm. Respiratory: Normal respiratory effort. No wheezes/rales/rhonchi noted. Neurologic:  Normal speech and language. No  gross focal neurologic deficits are appreciated. Skin: Very fine maculopapular rash noted of scalp at hairline and right ear. ____________________________________________  INITIAL IMPRESSION / ASSESSMENT AND PLAN / ED COURSE  Rash of Face, Right Ear:  DDx include contact dermatitis, eczema Rash most consistent with a contact dermatitis Rx for Prednisone 10 mg daily x3 days Rx for Triamcinolone cream twice daily as needed as needed for itching Follow-up with your PCP if symptoms persist or worsen ____________________________________________  FINAL CLINICAL IMPRESSION(S) / ED DIAGNOSES  Final diagnoses:  Irritant contact dermatitis due to cosmetics      Lorre Munroe, NP 04/18/21 0815    Gilles Chiquito, MD 04/18/21 1521

## 2021-05-27 ENCOUNTER — Encounter: Payer: Self-pay | Admitting: Emergency Medicine

## 2021-05-27 ENCOUNTER — Other Ambulatory Visit: Payer: Self-pay

## 2021-05-27 ENCOUNTER — Emergency Department
Admission: EM | Admit: 2021-05-27 | Discharge: 2021-05-27 | Disposition: A | Discharge status: Home / Self Care | Payer: BC Managed Care – PPO | Attending: Emergency Medicine | Admitting: Emergency Medicine

## 2021-05-27 DIAGNOSIS — I2510 Atherosclerotic heart disease of native coronary artery without angina pectoris: Secondary | ICD-10-CM | POA: Insufficient documentation

## 2021-05-27 DIAGNOSIS — Z79899 Other long term (current) drug therapy: Secondary | ICD-10-CM | POA: Diagnosis not present

## 2021-05-27 DIAGNOSIS — F17210 Nicotine dependence, cigarettes, uncomplicated: Secondary | ICD-10-CM | POA: Insufficient documentation

## 2021-05-27 DIAGNOSIS — R21 Rash and other nonspecific skin eruption: Secondary | ICD-10-CM | POA: Diagnosis present

## 2021-05-27 DIAGNOSIS — I10 Essential (primary) hypertension: Secondary | ICD-10-CM | POA: Diagnosis not present

## 2021-05-27 DIAGNOSIS — B359 Dermatophytosis, unspecified: Secondary | ICD-10-CM | POA: Insufficient documentation

## 2021-05-27 DIAGNOSIS — Z7982 Long term (current) use of aspirin: Secondary | ICD-10-CM | POA: Insufficient documentation

## 2021-05-27 MED ORDER — CLOTRIMAZOLE-BETAMETHASONE 1-0.05 % EX CREA: TOPICAL_CREAM | CUTANEOUS | 1 refills | Status: AC

## 2021-05-27 NOTE — Discharge Instructions (Signed)
Follow-up with your regular doctor if not improving in 3 days.  Return if worsening. ?

## 2021-05-27 NOTE — ED Triage Notes (Signed)
Pt comes into the ED via POV c/o rash under her left breast.  Pt states that she used some new soap and then she started having the rash.  Pt in NAD at this time.

## 2021-05-27 NOTE — ED Provider Notes (Signed)
[facility] Emergency Department Provider Note  ____________________________________________   Event Date/Time   First MD Initiated Contact with Patient 05/27/21 [phone]     (approximate)  I have reviewed the triage vital signs and the nursing notes.   HISTORY  Chief Complaint Rash    HPI Carla [name] is a 60 y.o. female presents emergency department complaining of rash under her breasts.  Patient is being treated for yeast on her scalp.  She is using a shampoo and also taken Diflucan once a week.  States that she is on week 2.  Noticed the rash under her breast and it is very itchy.  No drainage, fever or chills  Past Medical History:  Diagnosis Date   Coronary artery disease    Hypertension    Myocardial infarct, old     There are no problems to display for this patient.   Past Surgical History:  Procedure Laterality Date   ABDOMINAL HYSTERECTOMY     CARPAL TUNNEL RELEASE      Prior to Admission medications   Medication Sig Start Date End Date Taking? Authorizing Provider  clotrimazole-betamethasone (LOTRISONE) cream Apply to affected area 2 times daily 05/27/21 05/27/22 Yes Randon Somera, Roselyn Bering, PA-C  aspirin EC 81 MG tablet Take 1 tablet by mouth daily. 01/21/14   [provider]  famotidine (PEPCID) 20 MG tablet Take 1 tablet (20 mg total) by mouth 2 (two) times daily for 7 days. 02/02/19 02/09/19  Triplett, Rulon Eisenmenger B, FNP  hydrochlorothiazide (HYDRODIURIL) 12.5 MG tablet Take 1 tablet by mouth daily. 03/25/15   [provider]  metoprolol tartrate (LOPRESSOR) 25 MG tablet Take 25 mg by mouth daily.     [provider]  pantoprazole (PROTONIX) 40 MG tablet Take 1 tablet by mouth daily.    [provider]  sucralfate (CARAFATE) 1 g tablet Take 1 tablet (1 g total) by mouth 4 (four) times daily. 10/04/17 10/04/18  Emily Filbert, MD  telmisartan (MICARDIS) 80 MG tablet Take 80 mg by mouth daily. 07/03/17   [provider]  triamcinolone cream (KENALOG) 0.1 % Apply 1 application topically 2 (two) times daily. 04/18/21   Lorre Munroe, NP    Allergies Patient has no known allergies.  History reviewed. No pertinent family history.  Social History Social History   Tobacco Use   Smoking status: Every Day    Packs/day: 0.50    Types: Cigarettes   Smokeless tobacco: Never  Substance Use Topics   Alcohol use: No   Drug use: No    Review of Systems  Constitutional: No fever/chills Eyes: No visual changes. ENT: No sore throat. Respiratory: Denies cough Cardiovascular: Denies chest pain Gastrointestinal: Denies abdominal pain Genitourinary: Negative for dysuria. Musculoskeletal: Negative for back pain. Skin: Positive for rash. Psychiatric: no mood changes,     ____________________________________________   PHYSICAL EXAM:  VITAL SIGNS: ED Triage Vitals  Enc Vitals Group     BP 05/27/21 0722 (!) 162/82     Pulse Rate 05/27/21 0722 (!) 102     Resp 05/27/21 0722 18     Temp 05/27/21 0722 98.8 F (37.1 C)     Temp Source 05/27/21 0722 Oral     SpO2 05/27/21 0722 100 %     Weight 05/27/21 0715 182 lb 1.6 oz (82.6 kg)     Height 05/27/21 0715 5\' 3"  (1.6 m)     Head Circumference --      Peak Flow --  Pain Score 05/27/21 0715 0     Pain Loc --      Pain Edu? --      Excl. in GC? --     Constitutional: Alert and oriented. Well appearing and in no acute distress. Eyes: Conjunctivae are normal.  Head: Atraumatic. Nose: No congestion/rhinnorhea. Mouth/Throat: Mucous membranes are moist.   Neck:  supple no lymphadenopathy noted Cardiovascular: Normal rate, regular rhythm.  Respiratory: Normal respiratory effort.  No retractions, GU: deferred Musculoskeletal: FROM all extremities, warm and well perfused Neurologic:  Normal speech and language.  Skin:  Skin is warm, dry and intact.  Darkened area typical of tinea noted under breasts bilaterally, no drainage noted   psychiatric: Mood and affect are normal. Speech and behavior are normal.  ____________________________________________   LABS (all labs ordered are listed, but only abnormal results are displayed)  Labs Reviewed - No data to display ____________________________________________   ____________________________________________  RADIOLOGY    ____________________________________________   PROCEDURES  Procedure(s) performed: No  Procedures    ____________________________________________   INITIAL IMPRESSION / ASSESSMENT AND PLAN / ED COURSE  Pertinent labs & imaging results that were available during my care of the patient were reviewed by me and considered in my medical decision making (see chart for details).   Patient is a 60 year old female presents with yeastlike rash underneath her breasts.  See HPI.  Physical exam shows patient be stable  I did discuss the findings with the patient.  We went in detail over how to wash her sheets and clothing due to the spores from yeast.  She was given a prescription for Lotrisone.  She is to follow-up with her dermatologist if not improving in 1 week.  Return emergency department worsening.  Discharged in stable condition.     Carla [name] was evaluated in Emergency Department on 05/27/2021 for the symptoms described in the history of present illness. She was evaluated in the context of the global COVID-19 pandemic, which necessitated consideration that the patient might be at risk for infection with the SARS-CoV-2 virus that causes COVID-19. Institutional protocols and algorithms that pertain to the evaluation of patients at risk for COVID-19 are in a state of rapid change based on information released by regulatory bodies including the CDC and federal and state organizations. These policies and algorithms were followed during the patient's care in the ED.    As part of my medical decision making, I reviewed the following data  within the electronic MEDICAL RECORD NUMBER Nursing notes reviewed and incorporated, Old chart reviewed, Notes from prior ED visits, and [place] Controlled Substance Database  ____________________________________________   FINAL CLINICAL IMPRESSION(S) / ED DIAGNOSES  Final diagnoses:  Tinea      NEW MEDICATIONS STARTED DURING THIS VISIT:  New Prescriptions   CLOTRIMAZOLE-BETAMETHASONE (LOTRISONE) CREAM    Apply to affected area 2 times daily     Note:  This document was prepared using Dragon voice recognition software and may include unintentional dictation errors.    Faythe Ghee, PA-C 05/27/21 6789    Minna Antis, MD 05/27/21 1352

## 2021-05-27 NOTE — ED Notes (Signed)
See triage note  presents with some area under left breast and some rash to abd developed couple of days ago

## 2021-06-03 ENCOUNTER — Other Ambulatory Visit: Payer: Self-pay

## 2021-06-03 ENCOUNTER — Encounter: Payer: Self-pay | Admitting: Emergency Medicine

## 2021-06-03 ENCOUNTER — Emergency Department
Admission: EM | Admit: 2021-06-03 | Discharge: 2021-06-03 | Disposition: A | Discharge status: Home / Self Care | Payer: BC Managed Care – PPO | Attending: Emergency Medicine | Admitting: Emergency Medicine

## 2021-06-03 DIAGNOSIS — Z79899 Other long term (current) drug therapy: Secondary | ICD-10-CM | POA: Diagnosis not present

## 2021-06-03 DIAGNOSIS — N61 Mastitis without abscess: Secondary | ICD-10-CM | POA: Insufficient documentation

## 2021-06-03 DIAGNOSIS — I10 Essential (primary) hypertension: Secondary | ICD-10-CM | POA: Diagnosis not present

## 2021-06-03 DIAGNOSIS — I2510 Atherosclerotic heart disease of native coronary artery without angina pectoris: Secondary | ICD-10-CM | POA: Diagnosis not present

## 2021-06-03 DIAGNOSIS — Z7982 Long term (current) use of aspirin: Secondary | ICD-10-CM | POA: Diagnosis not present

## 2021-06-03 DIAGNOSIS — F17210 Nicotine dependence, cigarettes, uncomplicated: Secondary | ICD-10-CM | POA: Diagnosis not present

## 2021-06-03 DIAGNOSIS — L299 Pruritus, unspecified: Secondary | ICD-10-CM | POA: Diagnosis present

## 2021-06-03 DIAGNOSIS — L03818 Cellulitis of other sites: Secondary | ICD-10-CM

## 2021-06-03 MED ORDER — SULFAMETHOXAZOLE-TRIMETHOPRIM 800-160 MG PO TABS: 1.0000 | ORAL_TABLET | Freq: Two times a day (BID) | ORAL | 0 refills | Status: DC

## 2021-06-03 NOTE — ED Notes (Signed)
See triage note  presents with rash under both breast areas  positive itchy

## 2021-06-03 NOTE — Discharge Instructions (Addendum)
Take the antibiotic as directed. Apply warm compresses as directed.

## 2021-06-03 NOTE — ED Triage Notes (Signed)
Patient ambulatory to triage with steady gait, without difficulty or distress noted; pt reports itchy rash beneath breasts

## 2021-06-03 NOTE — ED Provider Notes (Signed)
[facility] Emergency Department Provider Note ____________________________________________  Time seen: 0728  I have reviewed the triage vital signs and the nursing notes.  HISTORY  Chief Complaint  Rash   HPI Carla [name] is a 60 y.o. female presents to the ED with complaints of itchy area on the left.  Area feels slightly firm but she denies any spontaneous purulent drainage.  She denies any significant tenderness, but thinks the area feels like a previous boil.  She is currently taking Diflucan weekly for a tinea capitis infection.  She denies any fevers, chills, sweats in the area.  She been treated previously with antifungal cream with some benefit.  She has not sought care with her PCP in the interim.  Past Medical History:  Diagnosis Date   Coronary artery disease    Hypertension    Myocardial infarct, old     There are no problems to display for this patient.   Past Surgical History:  Procedure Laterality Date   ABDOMINAL HYSTERECTOMY     CARPAL TUNNEL RELEASE      Prior to Admission medications   Medication Sig Start Date End Date Taking? Authorizing Provider  sulfamethoxazole-trimethoprim (BACTRIM DS) 800-160 MG tablet Take 1 tablet by mouth 2 (two) times daily. 06/03/21  Yes Drusilla Wampole, Charlesetta Ivory, PA-C  aspirin EC 81 MG tablet Take 1 tablet by mouth daily. 01/21/14   [provider]  clotrimazole-betamethasone (LOTRISONE) cream Apply to affected area 2 times daily 05/27/21 05/27/22  Sherrie Mustache, Roselyn Bering, PA-C  famotidine (PEPCID) 20 MG tablet Take 1 tablet (20 mg total) by mouth 2 (two) times daily for 7 days. 02/02/19 02/09/19  Triplett, Rulon Eisenmenger B, FNP  hydrochlorothiazide (HYDRODIURIL) 12.5 MG tablet Take 1 tablet by mouth daily. 03/25/15   [provider]  metoprolol tartrate (LOPRESSOR) 25 MG tablet Take 25 mg by mouth daily.     [provider]  pantoprazole (PROTONIX) 40 MG tablet Take 1 tablet by mouth daily.     [provider]  sucralfate (CARAFATE) 1 g tablet Take 1 tablet (1 g total) by mouth 4 (four) times daily. 10/04/17 10/04/18  Emily Filbert, MD  telmisartan (MICARDIS) 80 MG tablet Take 80 mg by mouth daily. 07/03/17   [provider]  triamcinolone cream (KENALOG) 0.1 % Apply 1 application topically 2 (two) times daily. 04/18/21   Lorre Munroe, NP    Allergies Patient has no known allergies.  History reviewed. No pertinent family history.  Social History Social History   Tobacco Use   Smoking status: Every Day    Packs/day: 0.50    Types: Cigarettes   Smokeless tobacco: Never  Vaping Use   Vaping Use: Never used  Substance Use Topics   Alcohol use: No   Drug use: No    Review of Systems  Constitutional: Negative for fever. Eyes: Negative for visual changes. ENT: Negative for sore throat. Cardiovascular: Negative for chest pain. Respiratory: Negative for shortness of breath. Gastrointestinal: Negative for abdominal pain, vomiting and diarrhea. Genitourinary: Negative for dysuria. Musculoskeletal: Negative for back pain. Skin: Negative for rash.  firm area to the left breast. Neurological: Negative for headaches, focal weakness or numbness. ____________________________________________  PHYSICAL EXAM:  VITAL SIGNS: ED Triage Vitals  Enc Vitals Group     BP 06/03/21 0617 (!) 156/85     Pulse Rate 06/03/21 0617 94     Resp 06/03/21 0617 18     Temp 06/03/21 0617 98 F (36.7 C)  Temp Source 06/03/21 0617 Oral     SpO2 06/03/21 0617 100 %     Weight 06/03/21 0618 165 lb (74.8 kg)     Height 06/03/21 0618 5\' 3"  (1.6 m)     Head Circumference --      Peak Flow --      Pain Score 06/03/21 0618 0     Pain Loc --      Pain Edu? --      Excl. in GC? --     Constitutional: Alert and oriented. Well appearing and in no distress. Head: Normocephalic and atraumatic. Eyes: Conjunctivae are normal. Normal extraocular movements Cardiovascular:  Normal rate, regular rhythm. Normal distal pulses. Respiratory: Normal respiratory effort. No wheezes/rales/rhonchi. Musculoskeletal: Nontender with normal range of motion in all extremities.  Neurologic:  Normal gait without ataxia. Normal speech and language. No gross focal neurologic deficits are appreciated. Skin:  Skin is warm, dry and intact. No rash noted.  Left breast with a 2 cm area of slightly hypertrophic skin.  The area is mildly erythematous thematous but no induration, warmth, pointing, or fluctuance is noted. Psychiatric: Mood and affect are normal. Patient exhibits appropriate insight and judgment. ____________________________________________    {LABS (pertinent positives/negatives)  ____________________________________________  {EKG  ____________________________________________   RADIOLOGY Official radiology report(s): No results found. ____________________________________________  PROCEDURES   Procedures ____________________________________________   INITIAL IMPRESSION / ASSESSMENT AND PLAN / ED COURSE  As part of my medical decision making, I reviewed the following data within the electronic MEDICAL RECORD NUMBER Notes from prior ED visits   DDX: cellulitis, abscess, candidiasis  Patient ED evaluation of a mildly tender, firm area to the left breast concerning for recent or developing cellulitis/abscess.  No spontaneous purulent drainage is noted.  No nipple changes are appreciated.  No fevers reported.  Patient be treated empirically with Bactrim at this time.  She will apply warm compress to help promote healing.  She will follow-up with primary provider or dermatologist for ongoing wound care.   Carla [name] was evaluated in Emergency Department on 06/03/2021 for the symptoms described in the history of present illness. She was evaluated in the context of the global COVID-19 pandemic, which necessitated consideration that the patient might be at risk for  infection with the SARS-CoV-2 virus that causes COVID-19. Institutional protocols and algorithms that pertain to the evaluation of patients at risk for COVID-19 are in a state of rapid change based on information released by regulatory bodies including the CDC and federal and state organizations. These policies and algorithms were followed during the patient's care in the ED. ____________________________________________  FINAL CLINICAL IMPRESSION(S) / ED DIAGNOSES  Final diagnoses:  Cellulitis of other specified site      06/05/2021, PA-C 06/03/21 0750    06/05/21, MD 06/03/21 [phone]

## 2021-07-29 ENCOUNTER — Emergency Department: Payer: BC Managed Care – PPO

## 2021-07-29 ENCOUNTER — Emergency Department
Admission: EM | Admit: 2021-07-29 | Discharge: 2021-07-29 | Disposition: A | Discharge status: Home / Self Care | Payer: BC Managed Care – PPO | Attending: Emergency Medicine | Admitting: Emergency Medicine

## 2021-07-29 ENCOUNTER — Encounter: Payer: Self-pay | Admitting: Emergency Medicine

## 2021-07-29 ENCOUNTER — Other Ambulatory Visit: Payer: Self-pay

## 2021-07-29 DIAGNOSIS — F4381 Prolonged grief disorder: Secondary | ICD-10-CM | POA: Diagnosis present

## 2021-07-29 DIAGNOSIS — I10 Essential (primary) hypertension: Secondary | ICD-10-CM | POA: Diagnosis not present

## 2021-07-29 DIAGNOSIS — I2510 Atherosclerotic heart disease of native coronary artery without angina pectoris: Secondary | ICD-10-CM | POA: Insufficient documentation

## 2021-07-29 DIAGNOSIS — E876 Hypokalemia: Secondary | ICD-10-CM | POA: Insufficient documentation

## 2021-07-29 DIAGNOSIS — R110 Nausea: Secondary | ICD-10-CM | POA: Insufficient documentation

## 2021-07-29 DIAGNOSIS — F4321 Adjustment disorder with depressed mood: Secondary | ICD-10-CM

## 2021-07-29 LAB — URINALYSIS, ROUTINE W REFLEX MICROSCOPIC
Bacteria, UA: NONE SEEN
Bilirubin Urine: NEGATIVE
Glucose, UA: NEGATIVE mg/dL
Ketones, ur: NEGATIVE mg/dL
Leukocytes,Ua: NEGATIVE
Nitrite: NEGATIVE
Protein, ur: NEGATIVE mg/dL
Specific Gravity, Urine: 1.005 (ref 1.005–1.030)
pH: 6 (ref 5.0–8.0)

## 2021-07-29 LAB — CBC WITH DIFFERENTIAL/PLATELET
Abs Immature Granulocytes: 0.08 10*3/uL — ABNORMAL HIGH (ref 0.00–0.07)
Basophils Absolute: 0.1 10*3/uL (ref 0.0–0.1)
Basophils Relative: 0 %
Eosinophils Absolute: 0.3 10*3/uL (ref 0.0–0.5)
Eosinophils Relative: 2 %
HCT: 39.1 % (ref 36.0–46.0)
Hemoglobin: 13 g/dL (ref 12.0–15.0)
Immature Granulocytes: 1 %
Lymphocytes Relative: 27 %
Lymphs Abs: 4 10*3/uL (ref 0.7–4.0)
MCH: 29.9 pg (ref 26.0–34.0)
MCHC: 33.2 g/dL (ref 30.0–36.0)
MCV: 89.9 fL (ref 80.0–100.0)
Monocytes Absolute: 1 10*3/uL (ref 0.1–1.0)
Monocytes Relative: 7 %
Neutro Abs: 9.2 10*3/uL — ABNORMAL HIGH (ref 1.7–7.7)
Neutrophils Relative %: 63 %
Platelets: 405 10*3/uL — ABNORMAL HIGH (ref 150–400)
RBC: 4.35 MIL/uL (ref 3.87–5.11)
RDW: 13.5 % (ref 11.5–15.5)
WBC: 14.5 10*3/uL — ABNORMAL HIGH (ref 4.0–10.5)
nRBC: 0 % (ref 0.0–0.2)

## 2021-07-29 LAB — COMPREHENSIVE METABOLIC PANEL
ALT: 20 U/L (ref 0–44)
AST: 27 U/L (ref 15–41)
Albumin: 4.1 g/dL (ref 3.5–5.0)
Alkaline Phosphatase: 64 U/L (ref 38–126)
Anion gap: 10 (ref 5–15)
BUN: 12 mg/dL (ref 6–20)
CO2: 26 mmol/L (ref 22–32)
Calcium: 9.3 mg/dL (ref 8.9–10.3)
Chloride: 101 mmol/L (ref 98–111)
Creatinine, Ser: 0.89 mg/dL (ref 0.44–1.00)
GFR, Estimated: >60 mL/min (ref >60)
Glucose, Bld: 143 mg/dL — ABNORMAL HIGH (ref 70–99)
Potassium: 2.4 mmol/L — CL (ref 3.5–5.1)
Sodium: 137 mmol/L (ref 135–145)
Total Bilirubin: 0.8 mg/dL (ref 0.3–1.2)
Total Protein: 7.4 g/dL (ref 6.5–8.1)

## 2021-07-29 LAB — LIPASE, BLOOD: Lipase: 24 U/L (ref 11–51)

## 2021-07-29 LAB — MAGNESIUM: Magnesium: 1.9 mg/dL (ref 1.7–2.4)

## 2021-07-29 MED ORDER — ONDANSETRON 4 MG PO TBDP
4.0000 mg | ORAL_TABLET | Freq: Once | ORAL | Status: AC
  Administered 2021-07-29: 4 mg via ORAL
  Filled 2021-07-29: qty 1

## 2021-07-29 MED ORDER — POTASSIUM CHLORIDE 10 MEQ/100ML IV SOLN
10.0000 meq | INTRAVENOUS | Status: AC
  Administered 2021-07-29 (×3): 10 meq via INTRAVENOUS
  Filled 2021-07-29 (×3): qty 100

## 2021-07-29 MED ORDER — LACTATED RINGERS IV BOLUS
1000.0000 mL | Freq: Once | INTRAVENOUS | Status: AC
  Administered 2021-07-29: 1000 mL via INTRAVENOUS

## 2021-07-29 MED ORDER — POTASSIUM CHLORIDE CRYS ER 20 MEQ PO TBCR
40.0000 meq | EXTENDED_RELEASE_TABLET | Freq: Once | ORAL | Status: AC
  Administered 2021-07-29: 40 meq via ORAL
  Filled 2021-07-29: qty 2

## 2021-07-29 MED ORDER — ONDANSETRON 4 MG PO TBDP: 4.0000 mg | ORAL_TABLET | Freq: Three times a day (TID) | ORAL | 0 refills | Status: DC | PRN

## 2021-07-29 MED ORDER — ONDANSETRON HCL 4 MG/2ML IJ SOLN
4.0000 mg | Freq: Once | INTRAMUSCULAR | Status: AC
  Administered 2021-07-29: 4 mg via INTRAVENOUS
  Filled 2021-07-29: qty 2

## 2021-07-29 NOTE — ED Triage Notes (Signed)
Patient ambulatory to triage with steady gait, without difficulty or distress noted; pt reports that her mother passed away recently and since "stomach has been messed up"; c/o nausea and unable to eat; denies any pain but reports some episodes of diarrhea

## 2021-07-29 NOTE — ED Provider Notes (Signed)
[facility] Provider Note    Event Date/Time   First MD Initiated Contact with Patient 07/29/21 [phone]     (approximate)   History   Nausea   HPI  Carla Hummingbirderesa A [name] is a 61 y.o. female with a history of CAD and hypertension who presents for evaluation of grieving.  Patient reports that she suddenly lost her mother 2 weeks ago.  She reports that she woke up in the middle of the night because her mother was asking to be helped to the bathroom.  The mother became short of breath and before 911 arrived that she went into cardiac arrest and died.  Patient reports that she has been very shaken up by this and very sad.  She denies a history of depression, she denies SI or HI.  She reports that she has not had any appetite since this happened.  She she is hardly eating anything at all.  She denies chest pain or shortness of breath, she denies abdominal pain, she denies vomiting or diarrhea, she denies fever or chills.  She just reports no appetite and some nausea.  She was very close to her mother and reports that she misses her very dearly.     Past Medical History:  Diagnosis Date   Coronary artery disease    Hypertension    Myocardial infarct, old     Past Surgical History:  Procedure Laterality Date   ABDOMINAL HYSTERECTOMY     CARPAL TUNNEL RELEASE       Physical Exam   Triage Vital Signs: ED Triage Vitals  Enc Vitals Group     BP 07/29/21 0227 (!) 141/66     Pulse Rate 07/29/21 0227 100     Resp 07/29/21 0227 18     Temp 07/29/21 0227 98.5 F (36.9 C)     Temp Source 07/29/21 0227 Oral     SpO2 07/29/21 0227 99 %     Weight 07/29/21 0228 170 lb (77.1 kg)     Height 07/29/21 0228 5\' 3"  (1.6 m)     Head Circumference --      Peak Flow --      Pain Score 07/29/21 0227 0     Pain Loc --      Pain Edu? --      Excl. in GC? --     Most recent vital signs: Vitals:   07/29/21 0227 07/29/21 0653  BP: (!) 141/66 113/62  Pulse: 100 83  Resp: 18 20   Temp: 98.5 F (36.9 C)   SpO2: 99% 99%     Constitutional: Alert and oriented, teary but in no apparent distress. HEENT:      Head: Normocephalic and atraumatic.         Eyes: Conjunctivae are normal. Sclera is non-icteric.       Mouth/Throat: Mucous membranes are moist.       Neck: Supple with no signs of meningismus. Cardiovascular: Regular rate and rhythm. No murmurs, gallops, or rubs. 2+ symmetrical distal pulses are present in all extremities.  Respiratory: Normal respiratory effort. Lungs are clear to auscultation bilaterally.  Gastrointestinal: Soft, non tender, and non distended with positive bowel sounds. No rebound or guarding. Genitourinary: No CVA tenderness. Musculoskeletal:  No edema, cyanosis, or erythema of extremities. Neurologic: Normal speech and language. Face is symmetric. Moving all extremities. No gross focal neurologic deficits are appreciated. Skin: Skin is warm, dry and intact. No rash noted. Psychiatric: Mood and affect are normal. Speech  and behavior are normal.  ED Results / Procedures / Treatments   Labs (all labs ordered are listed, but only abnormal results are displayed) Labs Reviewed  URINALYSIS, ROUTINE W REFLEX MICROSCOPIC - Abnormal; Notable for the following components:      Result Value   Color, Urine STRAW (*)    APPearance CLEAR (*)    Hgb urine dipstick MODERATE (*)    All other components within normal limits  CBC WITH DIFFERENTIAL/PLATELET - Abnormal; Notable for the following components:   WBC 14.5 (*)    Platelets 405 (*)    Neutro Abs 9.2 (*)    Abs Immature Granulocytes 0.08 (*)    All other components within normal limits  COMPREHENSIVE METABOLIC PANEL - Abnormal; Notable for the following components:   Potassium 2.4 (*)    Glucose, Bld 143 (*)    All other components within normal limits  LIPASE, BLOOD  MAGNESIUM     EKG  ED ECG REPORT I, Nita Sickle, the attending physician, personally viewed and interpreted  this ECG.  Sinus rhythm with first-degree AV block, occasional PVCs, normal QTC, no ST elevations or depressions.   RADIOLOGY none   PROCEDURES:  Critical Care performed: No  Procedures    IMPRESSION / MDM / ASSESSMENT AND PLAN / ED COURSE  I reviewed the triage vital signs and the nursing notes.  61 y.o. female with a history of CAD and hypertension who presents for evaluation of grieving.  Patient suddenly lost her mother who went into cardiac arrest in front of her at home 2 weeks ago.  Since then has been very sad with no appetite, hardly eating anything at all.  No SI or HI, no prior history of depression.  She does have a good support system with her 3 other siblings.  She has no abdominal pain, no fever, no chest pain or shortness of breath.  Ddx: Grieving versus depression    Plan: Basic labs to rule out dehydration or significant electrolyte derangements.     MEDICATIONS GIVEN IN ED: Medications  ondansetron (ZOFRAN-ODT) disintegrating tablet 4 mg (4 mg Oral Given 07/29/21 0231)  lactated ringers bolus 1,000 mL (0 mLs Intravenous Stopped 07/29/21 0602)  ondansetron (ZOFRAN) injection 4 mg (4 mg Intravenous Given 07/29/21 0348)  potassium chloride SA (KLOR-CON M) CR tablet 40 mEq (40 mEq Oral Given 07/29/21 0351)  potassium chloride 10 mEq in 100 mL IVPB (0 mEq Intravenous Stopped 07/29/21 0655)     ED COURSE: Patient found to be significantly hypokalemic with a potassium of 2.4 but no EKG changes.  Most likely due to decreased oral intake.  Magnesium is normal.  Patient was started on supplement p.o. and IV.  No signs of AKI.  Mild hyperglycemia with no evidence of DKA.  Patient has no abdominal pain or tenderness.  No SI or HI.  I did offer for patient to speak with a psychiatrist NP but she kindly declined.  No indication for IVC.  Patient was placed on telemetry for monitoring for any signs of dysrhythmias while receiving potassium supplementation.  Patient was given IV  Zofran for nausea and IV fluids for mild dehydration.  _________________________ 7:00 AM on 07/29/2021 ----------------------------------------- Patient received 3 rounds of K IV and one of PO K. Tolerating PO. Discussed K rich foods and close follow up with PCP. Discussed my standard return precautions. Patient remains stable with no EKG changes. Does not warrant admission   Consults: none   EMR reviewed including physical exam  from 03/2021 with patient's PCP          FINAL CLINICAL IMPRESSION(S) / ED DIAGNOSES   Final diagnoses:  Grieving  Hypokalemia     Rx / DC Orders   ED Discharge Orders          Ordered    ondansetron (ZOFRAN-ODT) 4 MG disintegrating tablet  Every 8 hours PRN        07/29/21 0455             Note:  This document was prepared using Dragon voice recognition software and may include unintentional dictation errors.   Don Perking, [place], MD 07/29/21 [phone]

## 2021-07-29 NOTE — ED Notes (Signed)
Pt unable to sign for discharge d/t no signature pad available 

## 2021-08-03 ENCOUNTER — Emergency Department
Admission: EM | Admit: 2021-08-03 | Discharge: 2021-08-03 | Disposition: A | Discharge status: Home / Self Care | Payer: BC Managed Care – PPO | Attending: Emergency Medicine | Admitting: Emergency Medicine

## 2021-08-03 ENCOUNTER — Other Ambulatory Visit: Payer: Self-pay

## 2021-08-03 DIAGNOSIS — R197 Diarrhea, unspecified: Secondary | ICD-10-CM | POA: Diagnosis present

## 2021-08-03 DIAGNOSIS — Z20822 Contact with and (suspected) exposure to covid-19: Secondary | ICD-10-CM | POA: Insufficient documentation

## 2021-08-03 DIAGNOSIS — I2510 Atherosclerotic heart disease of native coronary artery without angina pectoris: Secondary | ICD-10-CM | POA: Insufficient documentation

## 2021-08-03 DIAGNOSIS — I119 Hypertensive heart disease without heart failure: Secondary | ICD-10-CM | POA: Diagnosis not present

## 2021-08-03 LAB — CBC WITH DIFFERENTIAL/PLATELET
Abs Immature Granulocytes: 0.09 10*3/uL — ABNORMAL HIGH (ref 0.00–0.07)
Basophils Absolute: 0.1 10*3/uL (ref 0.0–0.1)
Basophils Relative: 0 %
Eosinophils Absolute: 0.1 10*3/uL (ref 0.0–0.5)
Eosinophils Relative: 1 %
HCT: 42.8 % (ref 36.0–46.0)
Hemoglobin: 14.1 g/dL (ref 12.0–15.0)
Immature Granulocytes: 1 %
Lymphocytes Relative: 23 %
Lymphs Abs: 3.7 10*3/uL (ref 0.7–4.0)
MCH: 29.9 pg (ref 26.0–34.0)
MCHC: 32.9 g/dL (ref 30.0–36.0)
MCV: 90.7 fL (ref 80.0–100.0)
Monocytes Absolute: 1.2 10*3/uL — ABNORMAL HIGH (ref 0.1–1.0)
Monocytes Relative: 7 %
Neutro Abs: 11.1 10*3/uL — ABNORMAL HIGH (ref 1.7–7.7)
Neutrophils Relative %: 68 %
Platelets: 483 10*3/uL — ABNORMAL HIGH (ref 150–400)
RBC: 4.72 MIL/uL (ref 3.87–5.11)
RDW: 13.9 % (ref 11.5–15.5)
WBC: 16.2 10*3/uL — ABNORMAL HIGH (ref 4.0–10.5)
nRBC: 0 % (ref 0.0–0.2)

## 2021-08-03 LAB — COMPREHENSIVE METABOLIC PANEL
ALT: 15 U/L (ref 0–44)
AST: 19 U/L (ref 15–41)
Albumin: 4.2 g/dL (ref 3.5–5.0)
Alkaline Phosphatase: 71 U/L (ref 38–126)
Anion gap: 11 (ref 5–15)
BUN: 9 mg/dL (ref 6–20)
CO2: 26 mmol/L (ref 22–32)
Calcium: 10.2 mg/dL (ref 8.9–10.3)
Chloride: 101 mmol/L (ref 98–111)
Creatinine, Ser: 0.73 mg/dL (ref 0.44–1.00)
GFR, Estimated: >60 mL/min (ref >60)
Glucose, Bld: 115 mg/dL — ABNORMAL HIGH (ref 70–99)
Potassium: 3.1 mmol/L — ABNORMAL LOW (ref 3.5–5.1)
Sodium: 138 mmol/L (ref 135–145)
Total Bilirubin: 0.6 mg/dL (ref 0.3–1.2)
Total Protein: 8.1 g/dL (ref 6.5–8.1)

## 2021-08-03 LAB — RESP PANEL BY RT-PCR (FLU A&B, COVID) ARPGX2
Influenza A by PCR: NEGATIVE
Influenza B by PCR: NEGATIVE
SARS Coronavirus 2 by RT PCR: NEGATIVE

## 2021-08-03 MED ORDER — POTASSIUM CHLORIDE CRYS ER 20 MEQ PO TBCR
40.0000 meq | EXTENDED_RELEASE_TABLET | Freq: Once | ORAL | Status: AC
  Administered 2021-08-03: 40 meq via ORAL
  Filled 2021-08-03: qty 2

## 2021-08-03 MED ORDER — POTASSIUM CHLORIDE CRYS ER 10 MEQ PO TBCR: 10.0000 meq | EXTENDED_RELEASE_TABLET | Freq: Every day | ORAL | 0 refills | Status: AC

## 2021-08-03 MED ORDER — LOPERAMIDE HCL 2 MG PO TABS
2.0000 mg | ORAL_TABLET | Freq: Four times a day (QID) | ORAL | 0 refills | Status: AC | PRN
Start: 2021-08-03 — End: 2021-08-13

## 2021-08-03 NOTE — ED Triage Notes (Signed)
First Nurse Note: Pt to ED via POV with c/o diarrhea. She was seen here on 07/29/21 for the same. It was found that she was dehydrated and her Potassium was low per pt.

## 2021-08-03 NOTE — ED Notes (Signed)
Pt given peanut butter & graham crackers, per request.

## 2021-08-03 NOTE — ED Provider Notes (Signed)
° °[facility] °Provider Note ° ° ° Event Date/Time  ° First MD Initiated Contact with Patient 08/03/21 1456   °  (approximate) ° ° °History  ° °Chief Complaint °Diarrhea ° ° °HPI ° °Carla [name] is a 60 y.o. female, history of hypertension, CAD, prior MI, presents emergency department for evaluation of diarrhea.  Patient states that she began having diarrhea approximately 2 weeks ago.  She initially saw her primary care provider who prescribed her nausea medication.  She says her diarrhea went away for about a week.  However, she says it has returned today.  Reports approximately 3 episodes of diarrhea since this morning.  Nonbloody.  Denies fever, sick contacts, recent consumption of new foods, or recent travel.  Denies chest pain, shortness of breath, abdominal pain, urinary symptoms, back pain, or headache. ° °History Limitations: No limitations. ° °  ° ° °Physical Exam  °Triage Vital Signs: °ED Triage Vitals  °Enc Vitals Group  °   BP 08/03/21 1423 (!) 152/90  °   Pulse Rate 08/03/21 1423 100  °   Resp 08/03/21 1423 18  °   Temp 08/03/21 1423 98.7 °F (37.1 °C)  °   Temp Source 08/03/21 1423 Oral  °   SpO2 08/03/21 1423 100 %  °   Weight 08/03/21 1436 169 lb 15.6 oz (77.1 kg)  °   Height 08/03/21 1436 5' 3" (1.6 m)  °   Head Circumference --   °   Peak Flow --   °   Pain Score 08/03/21 1424 0  °   Pain Loc --   °   Pain Edu? --   °   Excl. in GC? --   ° ° °Most recent vital signs: °Vitals:  ° 08/03/21 1423 08/03/21 1643  °BP: (!) 152/90 117/80  °Pulse: 100 98  °Resp: 18 18  °Temp: 98.7 °F (37.1 °C) 98.7 °F (37.1 °C)  °SpO2: 100% 99%  ° ° ° °Physical Exam °Constitutional:   °   General: She is not in acute distress. °   Appearance: Normal appearance. She is not ill-appearing.  °Pulmonary:  °   Effort: Pulmonary effort is normal.  °Abdominal:  °   General: Abdomen is flat.  °   Palpations: Abdomen is soft.  °   Tenderness: There is no abdominal tenderness.  °Skin: °   General: Skin is warm  and dry.  °   Capillary Refill: Capillary refill takes less than 2 seconds.  °Neurological:  °   Mental Status: She is alert. Mental status is at baseline.  ° ° ° ° °ED Results / Procedures / Treatments  °Labs °(all labs ordered are listed, but only abnormal results are displayed) °Labs Reviewed  °CBC WITH DIFFERENTIAL/PLATELET - Abnormal; Notable for the following components:  °    Result Value  ° WBC 16.2 (*)   ° Platelets 483 (*)   ° Neutro Abs 11.1 (*)   ° Monocytes Absolute 1.2 (*)   ° Abs Immature Granulocytes 0.09 (*)   ° All other components within normal limits  °COMPREHENSIVE METABOLIC PANEL - Abnormal; Notable for the following components:  ° Potassium 3.1 (*)   ° Glucose, Bld 115 (*)   ° All other components within normal limits  °RESP PANEL BY RT-PCR (FLU A&B, COVID) ARPGX2  °GASTROINTESTINAL PANEL BY PCR, STOOL (REPLACES STOOL CULTURE)  °C DIFFICILE QUICK SCREEN W PCR REFLEX    ° ° ° °EKG °Not applicable.   ° ° °  RADIOLOGY I personally viewed and evaluated these images as part of my medical decision making, as well as reviewing the written report by the radiologist.  ED Provider Interpretation: Not applicable.  No results found.  PROCEDURES:  Critical Care performed: None.  Procedures    MEDICATIONS ORDERED IN ED: Medications  potassium chloride SA (KLOR-CON M) CR tablet 40 mEq (40 mEq Oral Given 08/03/21 1607)     IMPRESSION / MDM / ASSESSMENT AND PLAN / ED COURSE  I reviewed the triage vital signs and the nursing notes.                              Carla [name] is a 61 y.o. female, history of hypertension, CAD, prior MI, presents emergency department for evaluation of diarrhea.  Patient states that she began having diarrhea approximately 2 weeks ago.  She initially saw her primary care provider who prescribed her nausea medication.  She says her diarrhea went away for about a week.  However, she says it has returned today.  Differentials include, but not limited  to: Serious: appendicitis, mesenteric ischemia, bacterial infection (clostridium difficile, salmonella, campylobacter, shigella), parasitic infection (giardia, entamoeba histolytica, cryptosporidia) Common/Non-emergent: viral gastroenteritis (norovirus, rotavirus), IBS, antibiotics/caffeine/alcohol use, celiac disease, diabetes, ulcerative colitis/Crohn disease.   Patient appears well.  Physical exam unremarkable.  Vital signs are within normal limits.  She is afebrile  Respiratory panel is negative for COVID-19 or influenza. CBC notable for leukocytosis at 16.2.  CMP notable for hypokalemia at 3.1, likely due to volume loss.  We will go ahead and treat with p.o. potassium.    Given the patient's history, physical exam, and work-up thus far, I do not suspect any life-threatening pathology.  I suspect that the patient is likely experiencing noninfectious diarrhea versus viral gastroenteritis.  Will give the patient the option for stool culture and C. difficile testing.  She states that she does not want to provide a stool sample at this time, but is happy to take the testing kit at home with her and turn it into the lab if her symptoms do not improve.  We will plan to discharge this patient with potassium supplementation, loperamide, and testing kit for stool testing.  Advised patient to follow-up with her primary care provider if symptoms do not subside.  Patient was provided with anticipatory guidance, return precautions, a work note, and Neurosurgeon. Encouraged the patient to return to the emergency department at any time if they begin to experience any new or worsening symptoms.       FINAL CLINICAL IMPRESSION(S) / ED DIAGNOSES   Final diagnoses:  Diarrhea, unspecified type     Rx / DC Orders   ED Discharge Orders          Ordered    loperamide (IMODIUM A-D) 2 MG tablet  4 times daily PRN        08/03/21 1631    potassium chloride (KLOR-CON M) 10 MEQ tablet  Daily         08/03/21 1631             Note:  This document was prepared using Dragon voice recognition software and may include unintentional dictation errors.   Teodoro Spray, [place] 08/03/21 1710    Rada Hay, MD 08/04/21 [phone]

## 2021-08-03 NOTE — Discharge Instructions (Addendum)
-  Please return to the emergency department anytime if you begin to experience any new or worsening symptoms. -Follow-up with your primary care provider as discussed. -Take medications as prescribed.

## 2021-08-03 NOTE — ED Notes (Signed)
See triage note  presents with diarrhea  states she had similar episode about 1 week ago   sx's returned last pm  no fever   states she has had 3 loose stools since last pm

## 2021-08-03 NOTE — ED Triage Notes (Signed)
Pt presents to ED with c/o of diarrhea. Pt states being seen recently for same. Pt states diarrhea came back when she started eating solids. Pt denies N/V. Pt denies fevers or chills.

## 2021-12-02 ENCOUNTER — Other Ambulatory Visit: Payer: Self-pay

## 2021-12-02 ENCOUNTER — Emergency Department
Admission: EM | Admit: 2021-12-02 | Discharge: 2021-12-02 | Disposition: A | Discharge status: Home / Self Care | Payer: BC Managed Care – PPO | Attending: Emergency Medicine | Admitting: Emergency Medicine

## 2021-12-02 ENCOUNTER — Emergency Department: Payer: BC Managed Care – PPO

## 2021-12-02 DIAGNOSIS — K5792 Diverticulitis of intestine, part unspecified, without perforation or abscess without bleeding: Secondary | ICD-10-CM | POA: Insufficient documentation

## 2021-12-02 DIAGNOSIS — R1030 Lower abdominal pain, unspecified: Secondary | ICD-10-CM | POA: Diagnosis present

## 2021-12-02 DIAGNOSIS — I2510 Atherosclerotic heart disease of native coronary artery without angina pectoris: Secondary | ICD-10-CM | POA: Insufficient documentation

## 2021-12-02 DIAGNOSIS — I10 Essential (primary) hypertension: Secondary | ICD-10-CM | POA: Insufficient documentation

## 2021-12-02 LAB — URINALYSIS, ROUTINE W REFLEX MICROSCOPIC
Bilirubin Urine: NEGATIVE
Glucose, UA: NEGATIVE mg/dL
Ketones, ur: NEGATIVE mg/dL
Leukocytes,Ua: NEGATIVE
Nitrite: NEGATIVE
Protein, ur: NEGATIVE mg/dL
Specific Gravity, Urine: 1.021 (ref 1.005–1.030)
pH: 5 (ref 5.0–8.0)

## 2021-12-02 LAB — CBC
HCT: 42.5 % (ref 36.0–46.0)
Hemoglobin: 13.5 g/dL (ref 12.0–15.0)
MCH: 29 pg (ref 26.0–34.0)
MCHC: 31.8 g/dL (ref 30.0–36.0)
MCV: 91.2 fL (ref 80.0–100.0)
Platelets: 437 10*3/uL — ABNORMAL HIGH (ref 150–400)
RBC: 4.66 MIL/uL (ref 3.87–5.11)
RDW: 14.3 % (ref 11.5–15.5)
WBC: 15.4 10*3/uL — ABNORMAL HIGH (ref 4.0–10.5)
nRBC: 0 % (ref 0.0–0.2)

## 2021-12-02 LAB — COMPREHENSIVE METABOLIC PANEL
ALT: 14 U/L (ref 0–44)
AST: 19 U/L (ref 15–41)
Albumin: 4.4 g/dL (ref 3.5–5.0)
Alkaline Phosphatase: 76 U/L (ref 38–126)
Anion gap: 10 (ref 5–15)
BUN: 11 mg/dL (ref 6–20)
CO2: 27 mmol/L (ref 22–32)
Calcium: 9.9 mg/dL (ref 8.9–10.3)
Chloride: 103 mmol/L (ref 98–111)
Creatinine, Ser: 0.72 mg/dL (ref 0.44–1.00)
GFR, Estimated: >60 mL/min (ref >60)
Glucose, Bld: 101 mg/dL — ABNORMAL HIGH (ref 70–99)
Potassium: 3.4 mmol/L — ABNORMAL LOW (ref 3.5–5.1)
Sodium: 140 mmol/L (ref 135–145)
Total Bilirubin: 0.2 mg/dL — ABNORMAL LOW (ref 0.3–1.2)
Total Protein: 8.7 g/dL — ABNORMAL HIGH (ref 6.5–8.1)

## 2021-12-02 LAB — LIPASE, BLOOD: Lipase: 23 U/L (ref 11–51)

## 2021-12-02 MED ORDER — IOHEXOL 300 MG/ML  SOLN
100.0000 mL | Freq: Once | INTRAMUSCULAR | Status: AC | PRN
  Administered 2021-12-02: 100 mL via INTRAVENOUS

## 2021-12-02 MED ORDER — AMOXICILLIN-POT CLAVULANATE 875-125 MG PO TABS: 1.0000 | ORAL_TABLET | Freq: Two times a day (BID) | ORAL | 0 refills | Status: DC

## 2021-12-02 NOTE — ED Triage Notes (Signed)
Pt via POV from home. Pt c/o lower abd pain. Denies NVD. Denies any fevers. Pt states it has been going on 3 days. Denies any major abd surgeries. Pt is A&Ox4 and NAD

## 2021-12-02 NOTE — ED Notes (Signed)
This RN answered call bell and unhooked pt to use the restroom. Pt ambulated with steady gait.

## 2021-12-02 NOTE — ED Provider Notes (Signed)
[facility] Provider Note    Event Date/Time   First MD Initiated Contact with Patient 12/02/21 0715     (approximate)   History   Abdominal Pain   HPI  Carla [name] is a 61 y.o. female has a history of coronary artery disease, hypertension, hysterectomy who presents with complaints of lower abdominal pain.  Patient reports 3 days of gradually worsening lower abdominal pain.  She reports some discomfort with stooling but no diarrhea.  No vomiting.  Does not think she has had a fever.  No dysuria.  No history of similar pain     Physical Exam   Triage Vital Signs: ED Triage Vitals  Enc Vitals Group     BP 12/02/21 0713 (!) 166/88     Pulse Rate 12/02/21 0713 95     Resp 12/02/21 0713 17     Temp 12/02/21 0713 98.4 F (36.9 C)     Temp Source 12/02/21 0713 Oral     SpO2 12/02/21 0713 98 %     Weight 12/02/21 0712 79.4 kg (175 lb)     Height 12/02/21 0712 1.6 m (5\' 3" )     Head Circumference --      Peak Flow --      Pain Score 12/02/21 0712 3     Pain Loc --      Pain Edu? --      Excl. in GC? --     Most recent vital signs: Vitals:   12/02/21 0915 12/02/21 0925  BP: 124/61 124/61  Pulse:  84  Resp:  16  Temp:    SpO2:  100%     General: Awake, no distress.  CV:  Good peripheral perfusion.  Resp:  Normal effort.  Abd:  No distention.  Tenderness palpation suprapubically and left lower quadrant, no CVA tenderness Other:     ED Results / Procedures / Treatments   Labs (all labs ordered are listed, but only abnormal results are displayed) Labs Reviewed  CBC - Abnormal; Notable for the following components:      Result Value   WBC 15.4 (*)    Platelets 437 (*)    All other components within normal limits  COMPREHENSIVE METABOLIC PANEL - Abnormal; Notable for the following components:   Potassium 3.4 (*)    Glucose, Bld 101 (*)    Total Protein 8.7 (*)    Total Bilirubin 0.2 (*)    All other components within normal  limits  URINALYSIS, ROUTINE W REFLEX MICROSCOPIC - Abnormal; Notable for the following components:   Color, Urine YELLOW (*)    APPearance HAZY (*)    Hgb urine dipstick MODERATE (*)    Bacteria, UA RARE (*)    All other components within normal limits  URINE CULTURE  LIPASE, BLOOD     EKG     RADIOLOGY CT abdomen pelvis viewed interpreted by me, no acute abnormality noted, pending radiology read    PROCEDURES:  Critical Care performed:   Procedures   MEDICATIONS ORDERED IN ED: Medications  iohexol (OMNIPAQUE) 300 MG/ML solution 100 mL (100 mLs Intravenous Contrast Given 12/02/21 0904)     IMPRESSION / MDM / ASSESSMENT AND PLAN / ED COURSE  I reviewed the triage vital signs and the nursing notes.  Patient's presentation is most consistent with acute presentation with potential threat to life or bodily function.  Patient presents with lower abdominal pain as described above.  Differential includes diverticulitis, abscess/infection, UTI, appendicitis  We will obtain labs, place IV and likely obtain CT abdomen pelvis  Reviewed outpatient records, patient saw her primary care provider on 21 April for hypertension management.  Lab work reviewed, elevated white blood cell count of 15.4, CMP overall unremarkable, lipase normal, urinalysis demonstrates some red blood cells  CT scan is overall reassuring, patient remains well-appearing.  She does have diverticulosis in the area where she is having discomfort I suspect early diverticulitis, will send urine culture as well  We will treat with Augmentin for diverticulitis            FINAL CLINICAL IMPRESSION(S) / ED DIAGNOSES   Final diagnoses:  Diverticulitis     Rx / DC Orders   ED Discharge Orders          Ordered    amoxicillin-clavulanate (AUGMENTIN) 875-125 MG tablet  2 times daily        12/02/21 0926             Note:  This document was prepared using Dragon voice recognition software  and may include unintentional dictation errors.   [name], MD 12/02/21 [phone]

## 2021-12-02 NOTE — ED Notes (Signed)
Pt to ED for dull constant abdominal pain in lower abdomen since 3 days ago after she ate [employer] fried okra. Denies urinary symptoms. EDP was just at bedside.

## 2021-12-02 NOTE — ED Notes (Signed)
Patient transported to CT 

## 2021-12-02 NOTE — ED Notes (Signed)
Provided warm blankets. Pt rates lower abdominal pain as 2/10 currently.

## 2021-12-02 NOTE — Discharge Instructions (Signed)
Your CT scan was very reassuring however I suspect you may have very early diverticulitis which we are treating with Augmentin.  We have also sent your urine for culture, if this grows any bacteria consistent with a urinary tract infection we will call you and need to change your antibiotics.  Please return to the ED if any worsening

## 2021-12-03 LAB — URINE CULTURE: Culture: NO GROWTH

## 2021-12-04 ENCOUNTER — Encounter: Payer: Self-pay | Admitting: Emergency Medicine

## 2021-12-04 ENCOUNTER — Other Ambulatory Visit: Payer: Self-pay

## 2021-12-04 DIAGNOSIS — D72829 Elevated white blood cell count, unspecified: Secondary | ICD-10-CM | POA: Insufficient documentation

## 2021-12-04 DIAGNOSIS — R1084 Generalized abdominal pain: Secondary | ICD-10-CM | POA: Insufficient documentation

## 2021-12-04 DIAGNOSIS — R197 Diarrhea, unspecified: Secondary | ICD-10-CM | POA: Insufficient documentation

## 2021-12-04 DIAGNOSIS — I10 Essential (primary) hypertension: Secondary | ICD-10-CM | POA: Diagnosis not present

## 2021-12-04 DIAGNOSIS — I2510 Atherosclerotic heart disease of native coronary artery without angina pectoris: Secondary | ICD-10-CM | POA: Diagnosis not present

## 2021-12-04 LAB — CBC
HCT: 41.9 % (ref 36.0–46.0)
Hemoglobin: 13.5 g/dL (ref 12.0–15.0)
MCH: 29.5 pg (ref 26.0–34.0)
MCHC: 32.2 g/dL (ref 30.0–36.0)
MCV: 91.5 fL (ref 80.0–100.0)
Platelets: 429 10*3/uL — ABNORMAL HIGH (ref 150–400)
RBC: 4.58 MIL/uL (ref 3.87–5.11)
RDW: 14.2 % (ref 11.5–15.5)
WBC: 14.3 10*3/uL — ABNORMAL HIGH (ref 4.0–10.5)
nRBC: 0 % (ref 0.0–0.2)

## 2021-12-04 NOTE — ED Triage Notes (Signed)
Pt arrived via POV with reports of continued abd pain and diarrhea, pt here on Thursday dx with diverticulitis. Pt states the pain started at the beginning of the week.

## 2021-12-05 ENCOUNTER — Emergency Department
Admission: EM | Admit: 2021-12-05 | Discharge: 2021-12-05 | Disposition: A | Discharge status: Home / Self Care | Payer: BC Managed Care – PPO | Attending: Emergency Medicine | Admitting: Emergency Medicine

## 2021-12-05 DIAGNOSIS — R197 Diarrhea, unspecified: Secondary | ICD-10-CM

## 2021-12-05 DIAGNOSIS — R109 Unspecified abdominal pain: Secondary | ICD-10-CM

## 2021-12-05 LAB — COMPREHENSIVE METABOLIC PANEL
ALT: 13 U/L (ref 0–44)
AST: 22 U/L (ref 15–41)
Albumin: 3.9 g/dL (ref 3.5–5.0)
Alkaline Phosphatase: 77 U/L (ref 38–126)
Anion gap: 7 (ref 5–15)
BUN: 10 mg/dL (ref 6–20)
CO2: 25 mmol/L (ref 22–32)
Calcium: 9.5 mg/dL (ref 8.9–10.3)
Chloride: 107 mmol/L (ref 98–111)
Creatinine, Ser: 0.77 mg/dL (ref 0.44–1.00)
GFR, Estimated: >60 mL/min (ref >60)
Glucose, Bld: 127 mg/dL — ABNORMAL HIGH (ref 70–99)
Potassium: 3.2 mmol/L — ABNORMAL LOW (ref 3.5–5.1)
Sodium: 139 mmol/L (ref 135–145)
Total Bilirubin: 0.7 mg/dL (ref 0.3–1.2)
Total Protein: 7.6 g/dL (ref 6.5–8.1)

## 2021-12-05 LAB — URINALYSIS, ROUTINE W REFLEX MICROSCOPIC
Bilirubin Urine: NEGATIVE
Glucose, UA: NEGATIVE mg/dL
Hgb urine dipstick: NEGATIVE
Ketones, ur: NEGATIVE mg/dL
Leukocytes,Ua: NEGATIVE
Nitrite: NEGATIVE
Protein, ur: NEGATIVE mg/dL
Specific Gravity, Urine: 1.01 (ref 1.005–1.030)
pH: 6 (ref 5.0–8.0)

## 2021-12-05 LAB — C DIFFICILE QUICK SCREEN W PCR REFLEX
C Diff antigen: NEGATIVE
C Diff interpretation: NOT DETECTED
C Diff toxin: NEGATIVE

## 2021-12-05 LAB — LIPASE, BLOOD: Lipase: 21 U/L (ref 11–51)

## 2021-12-05 MED ORDER — KETOROLAC TROMETHAMINE 15 MG/ML IJ SOLN
15.0000 mg | Freq: Once | INTRAMUSCULAR | Status: AC
Start: 2021-12-05 — End: 2021-12-05
  Administered 2021-12-05: 15 mg via INTRAVENOUS
  Filled 2021-12-05: qty 1

## 2021-12-05 NOTE — ED Provider Notes (Signed)
[facility] Provider Note    Event Date/Time   First MD Initiated Contact with Patient 12/05/21 0123     (approximate)   History   Abdominal Pain and Diarrhea   HPI  Carla [name] is a 61 y.o. female with a history of CAD and hypertension who presents for evaluation of abdominal cramping and diarrhea.  Patient was seen here 2 days ago for abdominal pain.  Had a CT that showed diverticulosis with no signs of diverticulitis.  Due to concerns of possible very early diverticulitis patient was started on Augmentin.  She reports that her symptoms have gotten worse since starting the antibiotics.  She reports that her diarrhea now was much more severe and she has 1 episode of watery diarrhea every time she eats.  She is complaining of diffuse cramping abdominal pain.  No melena, hematemesis, hematochezia, coffee-ground emesis, nausea or vomiting, fever or chills.  She has never had C. difficile before and denies any prior antibiotic use other than the last 24-hour     Past Medical History:  Diagnosis Date   Coronary artery disease    Hypertension    Myocardial infarct, old     Past Surgical History:  Procedure Laterality Date   ABDOMINAL HYSTERECTOMY     CARPAL TUNNEL RELEASE       Physical Exam   Triage Vital Signs: ED Triage Vitals  Enc Vitals Group     BP 12/04/21 2333 (!) 164/78     Pulse Rate 12/04/21 2333 87     Resp 12/04/21 2333 20     Temp 12/04/21 2333 98.3 F (36.8 C)     Temp src --      SpO2 12/04/21 2333 97 %     Weight 12/04/21 2327 175 lb (79.4 kg)     Height 12/04/21 2327 5\' 3"  (1.6 m)     Head Circumference --      Peak Flow --      Pain Score 12/04/21 2326 2     Pain Loc --      Pain Edu? --      Excl. in GC? --     Most recent vital signs: Vitals:   12/05/21 0144 12/05/21 0347  BP:  139/75  Pulse:  63  Resp:  16  Temp:    SpO2: 100% 100%     Constitutional: Alert and oriented. Well appearing and in no apparent  distress. HEENT:      Head: Normocephalic and atraumatic.         Eyes: Conjunctivae are normal. Sclera is non-icteric.       Mouth/Throat: Mucous membranes are moist.       Neck: Supple with no signs of meningismus. Cardiovascular: Regular rate and rhythm. No murmurs, gallops, or rubs. 2+ symmetrical distal pulses are present in all extremities.  Respiratory: Normal respiratory effort. Lungs are clear to auscultation bilaterally.  Gastrointestinal: Soft, non tender, and non distended with positive bowel sounds. No rebound or guarding. Genitourinary: No CVA tenderness. Musculoskeletal:  No edema, cyanosis, or erythema of extremities. Neurologic: Normal speech and language. Face is symmetric. Moving all extremities. No gross focal neurologic deficits are appreciated. Skin: Skin is warm, dry and intact. No rash noted. Psychiatric: Mood and affect are normal. Speech and behavior are normal.  ED Results / Procedures / Treatments   Labs (all labs ordered are listed, but only abnormal results are displayed) Labs Reviewed  COMPREHENSIVE METABOLIC PANEL - Abnormal; Notable for the  following components:      Result Value   Potassium 3.2 (*)    Glucose, Bld 127 (*)    All other components within normal limits  CBC - Abnormal; Notable for the following components:   WBC 14.3 (*)    Platelets 429 (*)    All other components within normal limits  URINALYSIS, ROUTINE W REFLEX MICROSCOPIC - Abnormal; Notable for the following components:   Color, Urine YELLOW (*)    APPearance CLEAR (*)    All other components within normal limits  C DIFFICILE QUICK SCREEN W PCR REFLEX    LIPASE, BLOOD     EKG  none   RADIOLOGY none   PROCEDURES:  Critical Care performed: No  Procedures    IMPRESSION / MDM / ASSESSMENT AND PLAN / ED COURSE  I reviewed the triage vital signs and the nursing notes.  61 y.o. female with a history of CAD and hypertension who presents for evaluation of abdominal  cramping and diarrhea.  Patient seen here 2 days ago for abdominal pain and found to have diverticulosis on CT.  No other acute findings.  Patient was started on Augmentin for concerns of possible early diverticulitis.  Since then patient reports worsening of her symptoms, now having a lot of diarrhea and more cramping.  On exam she is well-appearing in no distress with normal vital signs, abdomen soft and nontender  Ddx: Colitis versus viral gastroenteritis versus antibiotic side effect versus C. difficile   Plan: CBC, CMP, lipase, C. difficile, urinalysis.  Will give Toradol for pain.   MEDICATIONS GIVEN IN ED: Medications  ketorolac (TORADOL) 15 MG/ML injection 15 mg (15 mg Intravenous Given 12/05/21 0217)     ED COURSE: Labs show leukocytosis however white count is trending down from 15.4-14.3.  No significant electrolyte derangements, AKI, no signs of sepsis, normal LFTs and lipase, negative C. difficile.  UA with no signs of ketones or infection.  Patient feels markedly improved.  We discussed that since she feels worse on the antibiotics and the CT did not show diverticulitis that it would be safe to stop as the diarrhea could have been exacerbated by that.  We discussed dietary changes and increase oral hydration at home.  Abdomen remains soft with no tenderness therefore feel patient stable for discharge home with close follow-up with her primary care doctor.  Recommend returning to the hospital for fever, new or worsening abdominal pain   Consults: None   EMR reviewed including records from her last visit to the ER 2 days ago and last visit with her primary care doctor from April 2023 for hypertension, hyperlipidemia, and CAD    FINAL CLINICAL IMPRESSION(S) / ED DIAGNOSES   Final diagnoses:  Diarrhea, unspecified type  Abdominal cramping     Rx / DC Orders   ED Discharge Orders     None        Note:  This document was prepared using Dragon voice recognition software  and may include unintentional dictation errors.   Please note:  Patient was evaluated in Emergency Department today for the symptoms described in the history of present illness. Patient was evaluated in the context of the global COVID-19 pandemic, which necessitated consideration that the patient might be at risk for infection with the SARS-CoV-2 virus that causes COVID-19. Institutional protocols and algorithms that pertain to the evaluation of patients at risk for COVID-19 are in a state of rapid change based on information released by regulatory bodies including the  CDC and federal and [employer] organizations. These policies and algorithms were followed during the patient's care in the ED.  Some ED evaluations and interventions may be delayed as a result of limited staffing during the pandemic.       Don PerkingVeronese, [place], MD 12/05/21 [phone]

## 2021-12-05 NOTE — Discharge Instructions (Addendum)
Stop the antibiotics and take imodium instead for the diarrhea.  Please find attachment for bland diet for the next 2 to 3 days.  Follow-up with your primary care doctor.  Return to the hospital for new or worsening abdominal pain or fever.

## 2021-12-05 NOTE — ED Notes (Signed)
Pt endorses diarrhea each time she eats beginning this past Tuesday. Lower abdominal pain beginning Tuesday as well.  Pain is intermittent cramping and is worse upon defecation.

## 2022-01-21 ENCOUNTER — Other Ambulatory Visit: Payer: Self-pay

## 2022-01-21 ENCOUNTER — Emergency Department
Admission: EM | Admit: 2022-01-21 | Discharge: 2022-01-21 | Disposition: A | Discharge status: Home / Self Care | Payer: BC Managed Care – PPO | Attending: Emergency Medicine | Admitting: Emergency Medicine

## 2022-01-21 ENCOUNTER — Encounter: Payer: Self-pay | Admitting: Emergency Medicine

## 2022-01-21 ENCOUNTER — Emergency Department: Payer: BC Managed Care – PPO

## 2022-01-21 DIAGNOSIS — I10 Essential (primary) hypertension: Secondary | ICD-10-CM | POA: Diagnosis not present

## 2022-01-21 DIAGNOSIS — M79645 Pain in left finger(s): Secondary | ICD-10-CM | POA: Diagnosis present

## 2022-01-21 DIAGNOSIS — M19042 Primary osteoarthritis, left hand: Secondary | ICD-10-CM | POA: Insufficient documentation

## 2022-01-21 MED ORDER — MELOXICAM 15 MG PO TABS: 15.0000 mg | ORAL_TABLET | Freq: Every day | ORAL | 2 refills | Status: DC

## 2022-01-21 NOTE — ED Provider Notes (Signed)
[facility] [place] Provider Note    Event Date/Time   First MD Initiated Contact with Patient 01/21/22 1407     (approximate)   History   Hand Pain   HPI  Carla [name] is a 61 y.o. female with history of hypertension presents emergency department complaining of left fourth finger pain.  States just pain with movement feels stiff.  No known injury.  No redness or swelling.      Physical Exam   Triage Vital Signs: ED Triage Vitals  Enc Vitals Group     BP 01/21/22 1343 (!) 156/81     Pulse Rate 01/21/22 1343 100     Resp 01/21/22 1343 18     Temp 01/21/22 1343 99 F (37.2 C)     Temp Source 01/21/22 1343 Oral     SpO2 01/21/22 1343 100 %     Weight 01/21/22 1341 175 lb (79.4 kg)     Height 01/21/22 1341 5\' 3"  (1.6 m)     Head Circumference --      Peak Flow --      Pain Score 01/21/22 1341 1     Pain Loc --      Pain Edu? --      Excl. in GC? --     Most recent vital signs: Vitals:   01/21/22 1343  BP: (!) 156/81  Pulse: 100  Resp: 18  Temp: 99 F (37.2 C)  SpO2: 100%     General: Awake, no distress.   CV:  Good peripheral perfusion. regular rate and  rhythm Resp:  Normal effort.  Abd:  No distention.   Other:  Left fourth finger has full range of motion, minimally tender, neurovascular intact, skin is intact no redness is noted   ED Results / Procedures / Treatments   Labs (all labs ordered are listed, but only abnormal results are displayed) Labs Reviewed - No data to display   EKG     RADIOLOGY  X-ray of the left ring finger   PROCEDURES:   Procedures   MEDICATIONS ORDERED IN ED: Medications - No data to display   IMPRESSION / MDM / ASSESSMENT AND PLAN / ED COURSE  I reviewed the triage vital signs and the nursing notes.                              Differential diagnosis includes, but is not limited to, arthritis, paronychia, osteomyelitis  Patient's presentation is most consistent with acute  complicated illness / injury requiring diagnostic workup.   X-ray independently interpreted by me as being negative.  Radiologist read as osteoarthritis.  I did explain these findings to the patient.  She was given a prescription for meloxicam.  She will follow-up with her regular doctor for her already scheduled appointment.  Return if worsening.  She is in agreement treatment plan.  Discharged stable condition.      FINAL CLINICAL IMPRESSION(S) / ED DIAGNOSES   Final diagnoses:  Osteoarthritis of finger of left hand     Rx / DC Orders   ED Discharge Orders          Ordered    meloxicam (MOBIC) 15 MG tablet  Daily        01/21/22 1423             Note:  This document was prepared using Dragon voice recognition software and may include unintentional dictation errors.  Faythe Ghee, PA-C 01/21/22 1426    Shaune Pollack, MD 01/21/22 2234

## 2022-01-21 NOTE — Discharge Instructions (Signed)
Follow-up with your regular doctor.  Use medication as prescribed.  Do finger exercises to keep the muscles strong.  Return if worsening

## 2022-01-21 NOTE — ED Triage Notes (Signed)
Patient to ED via POV for finger pain- left hand ring finger. No known injury.

## 2022-02-08 ENCOUNTER — Other Ambulatory Visit: Payer: Self-pay | Admitting: Internal Medicine

## 2022-02-08 DIAGNOSIS — Z1231 Encounter for screening mammogram for malignant neoplasm of breast: Secondary | ICD-10-CM

## 2022-04-20 ENCOUNTER — Encounter: Payer: Self-pay | Admitting: Intensive Care

## 2022-04-20 ENCOUNTER — Other Ambulatory Visit: Payer: Self-pay

## 2022-04-20 ENCOUNTER — Emergency Department
Admission: EM | Admit: 2022-04-20 | Discharge: 2022-04-20 | Disposition: A | Discharge status: Home / Self Care | Payer: BC Managed Care – PPO | Attending: Emergency Medicine | Admitting: Emergency Medicine

## 2022-04-20 DIAGNOSIS — L84 Corns and callosities: Secondary | ICD-10-CM | POA: Diagnosis not present

## 2022-04-20 DIAGNOSIS — M79675 Pain in left toe(s): Secondary | ICD-10-CM | POA: Insufficient documentation

## 2022-04-20 DIAGNOSIS — I2510 Atherosclerotic heart disease of native coronary artery without angina pectoris: Secondary | ICD-10-CM | POA: Insufficient documentation

## 2022-04-20 DIAGNOSIS — I10 Essential (primary) hypertension: Secondary | ICD-10-CM | POA: Diagnosis not present

## 2022-04-20 NOTE — ED Provider Notes (Signed)
[facility] Provider Note    Event Date/Time   First MD Initiated Contact with Patient 04/20/22 [phone]     (approximate)   History   Chief Complaint Toe Pain   HPI  Carla [name] is a 61 y.o. female with past medical history of hypertension and CAD who presents to the ED complaining of toe pain.  Patient reports that for about the past 2 weeks she has been dealing with increasing pain between the third and fourth toes of her left foot.  She states that she has to walk a lot for work and it becomes especially painful while she is doing so.  She has noticed thick skin in this area that is painful to touch, but denies any redness, swelling, or purulent drainage.  She is not diabetic and denies any history of similar symptoms.     Physical Exam   Triage Vital Signs: ED Triage Vitals  Enc Vitals Group     BP 04/20/22 0730 (!) 166/97     Pulse Rate 04/20/22 0730 86     Resp 04/20/22 0730 18     Temp 04/20/22 0730 98.6 F (37 C)     Temp Source 04/20/22 0730 Oral     SpO2 04/20/22 0730 99 %     Weight 04/20/22 0728 175 lb (79.4 kg)     Height 04/20/22 0728 5\' 3"  (1.6 m)     Head Circumference --      Peak Flow --      Pain Score 04/20/22 0728 2     Pain Loc --      Pain Edu? --      Excl. in GC? --     Most recent vital signs: Vitals:   04/20/22 0730  BP: (!) 166/97  Pulse: 86  Resp: 18  Temp: 98.6 F (37 C)  SpO2: 99%    Constitutional: Alert and oriented. Eyes: Conjunctivae are normal. Head: Atraumatic. Nose: No congestion/rhinnorhea. Mouth/Throat: Mucous membranes are moist.  Cardiovascular: Normal rate, regular rhythm. Grossly normal heart sounds.  2+ radial and DP pulses bilaterally. Respiratory: Normal respiratory effort.  No retractions. Lungs CTAB. Gastrointestinal: Soft and nontender. No distention. Musculoskeletal: No lower extremity tenderness nor edema.  Tender callus noted between third and fourth toes of left foot with no  erythema, edema, warmth, or drainage. Neurologic:  Normal speech and language. No gross focal neurologic deficits are appreciated.    ED Results / Procedures / Treatments   Labs (all labs ordered are listed, but only abnormal results are displayed) Labs Reviewed - No data to display   PROCEDURES:  Critical Care performed: No  Procedures   MEDICATIONS ORDERED IN ED: Medications - No data to display   IMPRESSION / MDM / ASSESSMENT AND PLAN / ED COURSE  I reviewed the triage vital signs and the nursing notes.                              61 y.o. female with past medical history of hypertension and CAD who presents to the ED complaining of painful area of thick skin between the third and fourth toes of her left foot increasing over the past 2 weeks.  Patient's presentation is most consistent with acute, uncomplicated illness.  Differential diagnosis includes, but is not limited to, corn, callus, abscess, cellulitis.  Patient nontoxic-appearing and in no acute distress, vital signs are unremarkable and she is neurovascularly intact  to her bilateral lower extremities.  She appears to have corn versus callus developing between the third and fourth toes of her left foot, which correlates with the area of her pain.  There are no signs of infection, patient is not diabetic and I have no concerns for osteomyelitis.  She was counseled to wear her shoes with plenty of room in the toebox, also use over-the-counter padding to reduce rubbing when she walks.  She was provided referral to follow-up with podiatry and counseled to return to the ED for new or worsening symptoms.  Patient agrees with plan.      FINAL CLINICAL IMPRESSION(S) / ED DIAGNOSES   Final diagnoses:  Pain of toe of left foot  Corn of toe     Rx / DC Orders   ED Discharge Orders     None        Note:  This document was prepared using Dragon voice recognition software and may include unintentional dictation  errors.   Blake Divine, MD 04/20/22 [phone]

## 2022-04-20 NOTE — ED Triage Notes (Signed)
PAtient presents with left foot, middle toe pain

## 2022-04-22 DIAGNOSIS — L84 Corns and callosities: Secondary | ICD-10-CM | POA: Insufficient documentation

## 2022-04-26 ENCOUNTER — Ambulatory Visit: Payer: BC Managed Care – PPO | Admitting: Podiatry

## 2022-05-08 ENCOUNTER — Other Ambulatory Visit: Payer: Self-pay

## 2022-05-08 ENCOUNTER — Encounter: Payer: Self-pay | Admitting: Emergency Medicine

## 2022-05-08 ENCOUNTER — Emergency Department
Admission: EM | Admit: 2022-05-08 | Discharge: 2022-05-08 | Disposition: A | Discharge status: Home / Self Care | Payer: BC Managed Care – PPO | Attending: Emergency Medicine | Admitting: Emergency Medicine

## 2022-05-08 DIAGNOSIS — I10 Essential (primary) hypertension: Secondary | ICD-10-CM | POA: Diagnosis not present

## 2022-05-08 DIAGNOSIS — I2510 Atherosclerotic heart disease of native coronary artery without angina pectoris: Secondary | ICD-10-CM | POA: Diagnosis not present

## 2022-05-08 DIAGNOSIS — L84 Corns and callosities: Secondary | ICD-10-CM | POA: Diagnosis not present

## 2022-05-08 MED ORDER — KETOCONAZOLE 2 % EX CREA: 1.0000 | TOPICAL_CREAM | Freq: Every day | CUTANEOUS | 0 refills | Status: DC

## 2022-05-08 MED ORDER — SULFAMETHOXAZOLE-TRIMETHOPRIM 800-160 MG PO TABS: 1.0000 | ORAL_TABLET | Freq: Two times a day (BID) | ORAL | 0 refills | Status: DC

## 2022-05-08 NOTE — ED Provider Notes (Signed)
[facility] Provider Note    Event Date/Time   First MD Initiated Contact with Patient 05/08/22 [phone]     (approximate)   History   Blister   HPI  Carla [name] is a 61 y.o. female with history of hypertension, CAD, and remaining history as listed in the EMR presents to the emergency department for treatment and evaluation of a wound between toes of her left foot that has been worsening since having a pedicure about 3 weeks ago. She has had a callus in that area and was using the medicated bandaids until a few days ago. Area has started to peel. She now has a blister on the adjoining toe. Pain with walking.      Physical Exam   Triage Vital Signs: ED Triage Vitals  Enc Vitals Group     BP 05/08/22 0728 (!) 173/86     Pulse Rate 05/08/22 0728 81     Resp 05/08/22 0728 18     Temp 05/08/22 0728 98.6 F (37 C)     Temp Source 05/08/22 0728 Oral     SpO2 05/08/22 0728 100 %     Weight 05/08/22 0723 175 lb (79.4 kg)     Height 05/08/22 0723 5\' 3"  (1.6 m)     Head Circumference --      Peak Flow --      Pain Score 05/08/22 0723 3     Pain Loc --      Pain Edu? --      Excl. in [place]? --     Most recent vital signs: Vitals:   05/08/22 0728  BP: (!) 173/86  Pulse: 81  Resp: 18  Temp: 98.6 F (37 C)  SpO2: 100%     General: Awake, no distress.  CV:  Good peripheral perfusion.  Resp:  Normal effort.  Abd:  No distention.  Other:  Soft, hyperkeratotic area between 3rd and 4th toe of left foot. No open wound or lesion   ED Results / Procedures / Treatments   Labs (all labs ordered are listed, but only abnormal results are displayed) Labs Reviewed - No data to display   EKG     RADIOLOGY  Not indicated   PROCEDURES:  Critical Care performed: No  Procedures   MEDICATIONS ORDERED IN ED: Medications - No data to display   IMPRESSION / MDM / [place] / ED COURSE  I reviewed the triage vital signs and the  nursing notes.                              Differential diagnosis includes, but is not limited to: cellulitis; callus; chronic wound  Patient's presentation is most consistent with acute, uncomplicated illness.  62 year old female presents to the ER for re-evaluation of callus to the left foot. See HPI. Outside record from visit with ortho reviewed. Patient advised to continue the Epson Salt soaks. Plantar aspect of the 3rd toe extending medially to the dorsum is mildly erythematous. She will be placed empirically on antibiotic and advised to use ketoconazole. She is to follow up with ortho or podiatry if not improving over the next week or sooner if worse. ER return precautions discussed.     FINAL CLINICAL IMPRESSION(S) / ED DIAGNOSES   Final diagnoses:  Callus between toes     Rx / DC Orders   ED Discharge Orders  Ordered    sulfamethoxazole-trimethoprim (BACTRIM DS) 800-160 MG tablet  2 times daily        05/08/22 0907    ketoconazole (NIZORAL) 2 % cream  Daily        05/08/22 0907             Note:  This document was prepared using Dragon voice recognition software and may include unintentional dictation errors.   Chinita Pester, FNP 05/08/22 1437    Shaune Pollack, MD 05/08/22 2008

## 2022-05-08 NOTE — ED Triage Notes (Signed)
Pt reports went and got a pedicure 3 weeks ago and then had a blister come up shortly after between two toes on her left foot. Pt reports has been putting asper cream on it but no relief. Pt reports has not drained and is sore.

## 2022-05-08 NOTE — ED Notes (Signed)
Has been Soaking in epsom salt at home for past week. Between 3rd & 4th toe. L foot.

## 2022-05-18 ENCOUNTER — Ambulatory Visit: Payer: BC Managed Care – PPO | Admitting: Podiatry

## 2022-05-18 ENCOUNTER — Encounter: Payer: Self-pay | Admitting: Podiatry

## 2022-05-18 DIAGNOSIS — I214 Non-ST elevation (NSTEMI) myocardial infarction: Secondary | ICD-10-CM | POA: Insufficient documentation

## 2022-05-18 DIAGNOSIS — M2042 Other hammer toe(s) (acquired), left foot: Secondary | ICD-10-CM

## 2022-05-18 DIAGNOSIS — K219 Gastro-esophageal reflux disease without esophagitis: Secondary | ICD-10-CM | POA: Insufficient documentation

## 2022-05-18 DIAGNOSIS — R739 Hyperglycemia, unspecified: Secondary | ICD-10-CM | POA: Insufficient documentation

## 2022-05-18 DIAGNOSIS — G43909 Migraine, unspecified, not intractable, without status migrainosus: Secondary | ICD-10-CM | POA: Insufficient documentation

## 2022-05-18 DIAGNOSIS — L84 Corns and callosities: Secondary | ICD-10-CM

## 2022-05-18 DIAGNOSIS — Z720 Tobacco use: Secondary | ICD-10-CM | POA: Insufficient documentation

## 2022-05-19 NOTE — Progress Notes (Signed)
  Subjective:  Patient ID: Carla [name], female    DOB: [birth year],  MRN: [id number]  Chief Complaint  Patient presents with   Callouses    np left foot corn between toes 3 and 4 - very painful to walk - went to ED last week and they gave antibitotucs and topical cream - not much better    61 y.o. female presents with the above complaint. History confirmed with patient.   Objective:  Physical Exam: warm, good capillary refill, no trophic changes or ulcerative lesions, normal DP and PT pulses, normal sensory exam, and lesser hammertoe deformities and hallux valgus deformity, she has a heloma molle between the third and fourth toes.  Assessment:   1. Heloma molle   2. Hammertoe of left foot      Plan:  Patient was evaluated and treated and all questions answered.  All symptomatic hyperkeratoses were safely debrided with a sterile #15 blade to patient's level of comfort without incident. We discussed preventative and palliative care of these lesions including supportive and accommodative shoegear, padding, prefabricated and custom molded accommodative orthoses, use of a pumice stone and lotions/creams daily.  Dispensed silicone toe spreaders and toe caps.  Discussed the etiology of this lesion with with her hammertoe and bunion deformities.  Discussed offloading of these at appropriate shoe gear.  She will follow-up as needed if it returns  Return if symptoms worsen or fail to improve.

## 2022-06-20 ENCOUNTER — Emergency Department
Admission: EM | Admit: 2022-06-20 | Discharge: 2022-06-20 | Disposition: A | Discharge status: Home / Self Care | Payer: BC Managed Care – PPO | Attending: Student in an Organized Health Care Education/Training Program | Admitting: Student in an Organized Health Care Education/Training Program

## 2022-06-20 ENCOUNTER — Emergency Department: Payer: BC Managed Care – PPO

## 2022-06-20 ENCOUNTER — Encounter: Payer: Self-pay | Admitting: Radiology

## 2022-06-20 ENCOUNTER — Other Ambulatory Visit: Payer: Self-pay

## 2022-06-20 DIAGNOSIS — Z20822 Contact with and (suspected) exposure to covid-19: Secondary | ICD-10-CM | POA: Insufficient documentation

## 2022-06-20 DIAGNOSIS — Z7982 Long term (current) use of aspirin: Secondary | ICD-10-CM | POA: Diagnosis not present

## 2022-06-20 DIAGNOSIS — J069 Acute upper respiratory infection, unspecified: Secondary | ICD-10-CM | POA: Diagnosis not present

## 2022-06-20 DIAGNOSIS — R059 Cough, unspecified: Secondary | ICD-10-CM | POA: Diagnosis present

## 2022-06-20 LAB — RESP PANEL BY RT-PCR (FLU A&B, COVID) ARPGX2
Influenza A by PCR: NEGATIVE
Influenza B by PCR: NEGATIVE
SARS Coronavirus 2 by RT PCR: NEGATIVE

## 2022-06-20 MED ORDER — PROMETHAZINE-DM 6.25-15 MG/5ML PO SYRP: 5.0000 mL | ORAL_SOLUTION | Freq: Four times a day (QID) | ORAL | 0 refills | Status: DC | PRN

## 2022-06-20 MED ORDER — BENZONATATE 100 MG PO CAPS: 100.0000 mg | ORAL_CAPSULE | Freq: Three times a day (TID) | ORAL | 0 refills | Status: DC | PRN

## 2022-06-20 MED ORDER — BENZONATATE 100 MG PO CAPS
200.0000 mg | ORAL_CAPSULE | Freq: Once | ORAL | Status: AC
  Administered 2022-06-20: 200 mg via ORAL
  Filled 2022-06-20: qty 2

## 2022-06-20 NOTE — ED Triage Notes (Signed)
Patient reports cough and scratchy throat since yesterday. Denies fever, denies shortness of breath, denies chest pain. AOX4. Resp even, unlabored, ambulatory with steady gait.

## 2022-06-20 NOTE — ED Provider Notes (Signed)
[facility] EMERGENCY DEPARTMENT Provider Note   CSN: [id number] Arrival date & time: 06/20/22  2109     History  Chief Complaint  Patient presents with   Cough    Carla [name] is a 61 y.o. female.  Presents to the emergency department for evaluation of cough, scratchy throat since yesterday.  No fevers, body aches, chills, chest pain, shortness of breath.  She denies any wheezing.  Cough is dry nonproductive.  She has been taking Hall's cough drops.  She denies any abdominal pain nausea vomiting or diarrhea.  HPI     Home Medications Prior to Admission medications   Medication Sig Start Date End Date Taking? Authorizing Provider  promethazine-dextromethorphan (PROMETHAZINE-DM) 6.25-15 MG/5ML syrup Take 5 mLs by mouth 4 (four) times daily as needed for cough. 06/20/22  Yes Evon Slack, PA-C  aspirin EC 81 MG tablet Take 1 tablet by mouth daily. 01/21/14   [provider]  famotidine (PEPCID) 20 MG tablet Take 1 tablet (20 mg total) by mouth 2 (two) times daily for 7 days. 02/02/19 02/09/19  Triplett, Rulon Eisenmenger B, FNP  hydrochlorothiazide (HYDRODIURIL) 12.5 MG tablet Take 1 tablet by mouth daily. 03/25/15   [provider]  ketoconazole (NIZORAL) 2 % cream Apply 1 Application topically daily. 05/08/22   Triplett, Rulon Eisenmenger B, FNP  meloxicam (MOBIC) 15 MG tablet Take 1 tablet (15 mg total) by mouth daily. 01/21/22 01/21/23  Sherrie Mustache Roselyn Bering, PA-C  metoprolol tartrate (LOPRESSOR) 25 MG tablet Take 25 mg by mouth daily.     [provider]  ondansetron (ZOFRAN-ODT) 4 MG disintegrating tablet Take 1 tablet (4 mg total) by mouth every 8 (eight) hours as needed for nausea or vomiting. 07/29/21   Don Perking, [place], MD  pantoprazole (PROTONIX) 40 MG tablet Take 1 tablet by mouth daily.    [provider]  potassium chloride (KLOR-CON M) 10 MEQ tablet Take 1 tablet (10 mEq total) by mouth daily for 5 days. 08/03/21 08/08/21  Varney Daily, PA   sucralfate (CARAFATE) 1 g tablet Take 1 tablet (1 g total) by mouth 4 (four) times daily. 10/04/17 10/04/18  Emily Filbert, MD  sulfamethoxazole-trimethoprim (BACTRIM DS) 800-160 MG tablet Take 1 tablet by mouth 2 (two) times daily. 05/08/22   Triplett, Cari B, FNP  telmisartan (MICARDIS) 80 MG tablet Take 80 mg by mouth daily. 07/03/17   [provider]  triamcinolone cream (KENALOG) 0.1 % Apply 1 application topically 2 (two) times daily. 04/18/21   Lorre Munroe, NP      Allergies    Atorvastatin and Rosuvastatin    Review of Systems   Review of Systems  Physical Exam Updated Vital Signs BP (!) 180/85 (BP Location: Left Arm)   Pulse 92   Temp 98.7 F (37.1 C) (Oral)   Resp 20   Ht 5\' 3"  (1.6 m)   Wt 74.8 kg   SpO2 97%   BMI 29.23 kg/m  Physical Exam Constitutional:      General: She is not in acute distress.    Appearance: She is well-developed.  HENT:     Head: Normocephalic and atraumatic.     Jaw: No trismus.     Right Ear: Hearing, tympanic membrane, ear canal and external ear normal.     Left Ear: Hearing, tympanic membrane, ear canal and external ear normal.     Nose: Rhinorrhea present. No congestion.     Mouth/Throat:     Mouth: Mucous membranes are  moist.     Pharynx: Posterior oropharyngeal erythema present. No oropharyngeal exudate or uvula swelling.     Tonsils: No tonsillar exudate or tonsillar abscesses.  Eyes:     General:        Right eye: No discharge.        Left eye: No discharge.     Conjunctiva/sclera: Conjunctivae normal.  Cardiovascular:     Rate and Rhythm: Normal rate and regular rhythm.     Pulses: Normal pulses.  Pulmonary:     Effort: Pulmonary effort is normal. No respiratory distress.     Breath sounds: Normal breath sounds. No stridor. No wheezing, rhonchi or rales.  Chest:     Chest wall: No tenderness.  Abdominal:     General: Abdomen is flat. Bowel sounds are normal. There is no distension.     Palpations:  Abdomen is soft.     Tenderness: There is no abdominal tenderness.  Musculoskeletal:        General: No deformity. Normal range of motion.     Cervical back: Normal range of motion.  Lymphadenopathy:     Cervical: Cervical adenopathy present.  Skin:    General: Skin is warm and dry.  Neurological:     General: No focal deficit present.     Mental Status: She is alert and oriented to person, place, and time.     Motor: No weakness.     Deep Tendon Reflexes: Reflexes are normal and symmetric.  Psychiatric:        Behavior: Behavior normal.        Thought Content: Thought content normal.     ED Results / Procedures / Treatments   Labs (all labs ordered are listed, but only abnormal results are displayed) Labs Reviewed  RESP PANEL BY RT-PCR (FLU A&B, COVID) ARPGX2    EKG None  Radiology DG Chest 2 View  Result Date: 06/20/2022 CLINICAL DATA:  Cough EXAM: CHEST - 2 VIEW COMPARISON:  01/19/2014 FINDINGS: The heart size and mediastinal contours are within normal limits. Both lungs are clear. The visualized skeletal structures are unremarkable. IMPRESSION: Normal study. Electronically Signed   By: Charlett Nose M.D.   On: 06/20/2022 22:20    Procedures Procedures    Medications Ordered in ED Medications - No data to display  ED Course/ Medical Decision Making/ A&P                           Medical Decision Making Risk Prescription drug management.   61 year old female with mild viral symptoms of cough, sore throat that started in the last 24 hours.  Vital signs are stable, afebrile.  No respiratory distress.  Lungs are clear to auscultation bilaterally.  Chest x-ray negative for any acute cardiopulmonary process.  Flu and COVID swab negative.  No signs of bacterial infection such as strep on exam.  Patient tolerating p.o. well with no abdominal pain nausea vomiting or diarrhea.  Patient is given prescription for cough medication.  She will continue with symptomatic treatment  at home.  She will increase fluids and she understands signs symptoms return to the ER for. Final Clinical Impression(s) / ED Diagnoses Final diagnoses:  Viral URI with cough    Rx / DC Orders ED Discharge Orders          Ordered    promethazine-dextromethorphan (PROMETHAZINE-DM) 6.25-15 MG/5ML syrup  4 times daily PRN        06/20/22 2323  Evon Slack, PA-C 06/20/22 2325    Willy Eddy, MD 06/21/22 0001

## 2022-06-20 NOTE — ED Provider Triage Note (Signed)
Emergency Medicine Provider Triage Evaluation Note  Carla [name] , a 61 y.o. female  was evaluated in triage.  Pt complains of scratchy throat and dry cough since last night. No fever. No CP/SOB.    Review of Systems  Positive: Cough, sore throat Negative: fever  Physical Exam  There were no vitals taken for this visit. Gen:   Awake, no distress   Resp:  Normal effort  MSK:   Moves extremities without difficulty  Other:    Medical Decision Making  Medically screening exam initiated at 9:25 PM.  Appropriate orders placed.  Carla [name] was informed that the remainder of the evaluation will be completed by another provider, this initial triage assessment does not replace that evaluation, and the importance of remaining in the ED until their evaluation is complete.     Carla [name] 06/20/22 2127

## 2022-06-20 NOTE — ED Notes (Signed)
Patient declined having vital signs taken at this time.

## 2022-06-20 NOTE — Discharge Instructions (Addendum)
You may take Tylenol as needed for sore throat, body aches.  Take cough medication as prescribed.  Return to the ER for any fevers above 101, worsening cough, chest pain, shortness of breath or any urgent changes in her health.

## 2022-07-21 ENCOUNTER — Emergency Department
Admission: EM | Admit: 2022-07-21 | Discharge: 2022-07-21 | Disposition: A | Discharge status: Home / Self Care | Payer: BC Managed Care – PPO | Attending: Emergency Medicine | Admitting: Emergency Medicine

## 2022-07-21 ENCOUNTER — Encounter: Payer: Self-pay | Admitting: Emergency Medicine

## 2022-07-21 ENCOUNTER — Other Ambulatory Visit: Payer: Self-pay

## 2022-07-21 DIAGNOSIS — R0981 Nasal congestion: Secondary | ICD-10-CM

## 2022-07-21 DIAGNOSIS — I2510 Atherosclerotic heart disease of native coronary artery without angina pectoris: Secondary | ICD-10-CM | POA: Insufficient documentation

## 2022-07-21 DIAGNOSIS — U071 COVID-19: Secondary | ICD-10-CM | POA: Diagnosis not present

## 2022-07-21 DIAGNOSIS — Z5329 Procedure and treatment not carried out because of patient's decision for other reasons: Secondary | ICD-10-CM | POA: Diagnosis not present

## 2022-07-21 DIAGNOSIS — J069 Acute upper respiratory infection, unspecified: Secondary | ICD-10-CM

## 2022-07-21 LAB — RESP PANEL BY RT-PCR (RSV, FLU A&B, COVID)  RVPGX2
Influenza A by PCR: NEGATIVE
Influenza B by PCR: NEGATIVE
Resp Syncytial Virus by PCR: NEGATIVE
SARS Coronavirus 2 by RT PCR: POSITIVE — AB

## 2022-07-21 NOTE — ED Triage Notes (Signed)
Pt comes with c/o runny nose congestion, headache for few days. Pt states no relief with meds.

## 2022-07-21 NOTE — ED Provider Notes (Signed)
[facility] Provider Note    Event Date/Time   First MD Initiated Contact with Patient 07/21/22 [phone]     (approximate)   History   Nasal Congestion   HPI  Carla [name] is a 62 y.o. female with a past medical history of gold, hyperlipidemia, hypertension who presents today for evaluation of nasal congestion for the past 2 days.  Patient reports that her brother is sick with the same symptoms and she saw her brother 2 days ago.  She denies fevers or chills.  She reports that she has a slight cough.  She denies chest pain or shortness of breath.  No abdominal pain, nausea, vomiting, diarrhea.  She has been taking Mucinex for her symptoms.  Patient Active Problem List   Diagnosis Date Noted   GERD (gastroesophageal reflux disease) 05/18/2022   Hyperglycemia 05/18/2022   Migraine 05/18/2022   Non Q wave myocardial infarction ([place]) 05/18/2022   Tobacco abuse 05/18/2022   Soft corn 04/22/2022   Statin myopathy 12/27/2019   Mixed hyperlipidemia 11/04/2014   Benign essential hypertension 08/01/2014   Coronary artery disease involving native coronary artery of native heart with unstable angina pectoris ([place]) 08/01/2014   Tobacco dependency 08/01/2014          Physical Exam   Triage Vital Signs: ED Triage Vitals  Enc Vitals Group     BP 07/21/22 0715 (!) 171/80     Pulse Rate 07/21/22 0715 91     Resp 07/21/22 0715 18     Temp 07/21/22 0715 98.4 F (36.9 C)     Temp src --      SpO2 07/21/22 0715 100 %     Weight --      Height --      Head Circumference --      Peak Flow --      Pain Score 07/21/22 0714 1     Pain Loc --      Pain Edu? --      Excl. in [place]? --     Most recent vital signs: Vitals:   07/21/22 0715  BP: (!) 171/80  Pulse: 91  Resp: 18  Temp: 98.4 F (36.9 C)  SpO2: 100%    Physical Exam Vitals and nursing note reviewed.  Constitutional:      General: Awake and alert. No acute distress.    Appearance: Normal  appearance. The patient is normal weight.  HENT:     Head: Normocephalic and atraumatic.     Mouth: Mucous membranes are moist.  Nasal congestion present Eyes:     General: PERRL. Normal EOMs        Right eye: No discharge.        Left eye: No discharge.     Conjunctiva/sclera: Conjunctivae normal.  Cardiovascular:     Rate and Rhythm: Normal rate and regular rhythm.     Pulses: Normal pulses.     Heart sounds: Normal heart sounds Pulmonary:     Effort: Pulmonary effort is normal. No respiratory distress.     Breath sounds: Normal breath sounds.  Abdominal:     Abdomen is soft. There is no abdominal tenderness. No rebound or guarding. No distention. Musculoskeletal:        General: No swelling. Normal range of motion.     Cervical back: Normal range of motion and neck supple.  Skin:    General: Skin is warm and dry.     Capillary Refill: Capillary refill takes  less than 2 seconds.     Findings: No rash.  Neurological:     Mental Status: The patient is awake and alert.      ED Results / Procedures / Treatments   Labs (all labs ordered are listed, but only abnormal results are displayed) Labs Reviewed  RESP PANEL BY RT-PCR (RSV, FLU A&B, COVID)  RVPGX2 - Abnormal; Notable for the following components:      Result Value   SARS Coronavirus 2 by RT PCR POSITIVE (*)    All other components within normal limits     EKG     RADIOLOGY     PROCEDURES:  Critical Care performed:   Procedures   MEDICATIONS ORDERED IN ED: Medications - No data to display   IMPRESSION / MDM / [place] / ED COURSE  I reviewed the triage vital signs and the nursing notes.   Differential diagnosis includes, but is not limited to, URI, COVID-19, RSV, influenza.  Patient is awake and alert, hemodynamically stable and afebrile.  She is nontoxic in appearance.  She demonstrates no increased work of breathing.  She does not wish to stay for the results of her swab and requests  a work note only. She was offered nasal spray but declined.   We discussed symptomatic management and return precautions.  She was discharged in stable condition.  Follow-up: Swab returned positive for COVID-19.  I called patient and spoke with her directly to discuss these results.  She was advised that she must remain out of work for 5 days, and then when she returns to work she must be masked for 5 days.  All questions were answered.  Patient's presentation is most consistent with acute complicated illness / injury requiring diagnostic workup.     FINAL CLINICAL IMPRESSION(S) / ED DIAGNOSES   Final diagnoses:  Upper respiratory tract infection, unspecified type  Nasal congestion  COVID-19     Rx / DC Orders   ED Discharge Orders     None        Note:  This document was prepared using Dragon voice recognition software and may include unintentional dictation errors.   Marquette Old, PA-C 07/21/22 0900    Vanessa Roberta, MD 07/21/22 [phone]

## 2022-07-21 NOTE — ED Notes (Signed)
See triage note  Presents with some nasal congestion ,runny nose and headache  Sx's started couple of days ago   Afebrile on arrival

## 2022-07-21 NOTE — Discharge Instructions (Signed)
We will notify you if your swab results are positive.  Meantime, you may continue to take Tylenol/ibuprofen per package instructions to help with your symptoms.  Please return for any new, worsening, or change in symptoms or other concerns.  It was a pleasure caring for you today.

## 2022-07-24 ENCOUNTER — Other Ambulatory Visit: Payer: Self-pay

## 2022-07-24 ENCOUNTER — Emergency Department
Admission: EM | Admit: 2022-07-24 | Discharge: 2022-07-24 | Disposition: A | Discharge status: Home / Self Care | Payer: BC Managed Care – PPO | Attending: Emergency Medicine | Admitting: Emergency Medicine

## 2022-07-24 DIAGNOSIS — R109 Unspecified abdominal pain: Secondary | ICD-10-CM | POA: Diagnosis present

## 2022-07-24 DIAGNOSIS — R197 Diarrhea, unspecified: Secondary | ICD-10-CM | POA: Insufficient documentation

## 2022-07-24 DIAGNOSIS — R1084 Generalized abdominal pain: Secondary | ICD-10-CM | POA: Diagnosis not present

## 2022-07-24 LAB — COMPREHENSIVE METABOLIC PANEL
ALT: 18 U/L (ref 0–44)
AST: 21 U/L (ref 15–41)
Albumin: 4.2 g/dL (ref 3.5–5.0)
Alkaline Phosphatase: 76 U/L (ref 38–126)
Anion gap: 11 (ref 5–15)
BUN: 24 mg/dL — ABNORMAL HIGH (ref 6–20)
CO2: 24 mmol/L (ref 22–32)
Calcium: 9.8 mg/dL (ref 8.9–10.3)
Chloride: 103 mmol/L (ref 98–111)
Creatinine, Ser: 0.79 mg/dL (ref 0.44–1.00)
GFR, Estimated: >60 mL/min (ref >60)
Glucose, Bld: 112 mg/dL — ABNORMAL HIGH (ref 70–99)
Potassium: 3.7 mmol/L (ref 3.5–5.1)
Sodium: 138 mmol/L (ref 135–145)
Total Bilirubin: 0.6 mg/dL (ref 0.3–1.2)
Total Protein: 8.6 g/dL — ABNORMAL HIGH (ref 6.5–8.1)

## 2022-07-24 LAB — CBC
HCT: 45.1 % (ref 36.0–46.0)
Hemoglobin: 14.6 g/dL (ref 12.0–15.0)
MCH: 29.3 pg (ref 26.0–34.0)
MCHC: 32.4 g/dL (ref 30.0–36.0)
MCV: 90.6 fL (ref 80.0–100.0)
Platelets: 441 10*3/uL — ABNORMAL HIGH (ref 150–400)
RBC: 4.98 MIL/uL (ref 3.87–5.11)
RDW: 15 % (ref 11.5–15.5)
WBC: 12.2 10*3/uL — ABNORMAL HIGH (ref 4.0–10.5)
nRBC: 0 % (ref 0.0–0.2)

## 2022-07-24 LAB — URINALYSIS, ROUTINE W REFLEX MICROSCOPIC
Bilirubin Urine: NEGATIVE
Glucose, UA: NEGATIVE mg/dL
Ketones, ur: NEGATIVE mg/dL
Leukocytes,Ua: NEGATIVE
Nitrite: NEGATIVE
Protein, ur: NEGATIVE mg/dL
Specific Gravity, Urine: 1.017 (ref 1.005–1.030)
pH: 5 (ref 5.0–8.0)

## 2022-07-24 LAB — LIPASE, BLOOD: Lipase: 29 U/L (ref 11–51)

## 2022-07-24 NOTE — Discharge Instructions (Signed)
You are seen in the emergency department for diarrhea from [place].  It is importantly stay hydrated and drink plenty of fluids.  Drink Pedialyte, Gatorade/Powerade for good fluids and electrolytes.  You can use Imodium which is an antidiarrheal over the counter medication as needed for diarrhea.  Only use if you are having significant amount of diarrhea.  Pain control:  Ibuprofen (motrin/aleve/advil) - You can take 3-4 tablets (600-800 mg) every 6 hours as needed for pain/fever.  Acetaminophen (tylenol) - You can take 2 extra strength tablets (1000 mg) every 6 hours as needed for pain/fever.  You can alternate these medications or take them together.  Make sure you eat food/drink water when taking these medications.

## 2022-07-24 NOTE — ED Provider Notes (Signed)
[facility] Provider Note    Event Date/Time   First MD Initiated Contact with Patient 07/24/22 [phone]     (approximate)   History   Abdominal Pain   HPI  Carla [name] is a 62 y.o. female presents to the emergency department with abdominal pain and cramping.  Patient states that she tested positive for COVID 6 days ago and since that time she has been having ongoing abdominal cramping and diarrhea.  Denies any blood in her stool.  No recent antibiotic use.  2 episodes of diarrhea today which was watery.  Denies any abdominal cramping at this time.  Denies any significant nausea or vomiting.  No significant shortness of breath.     Physical Exam   Triage Vital Signs: ED Triage Vitals  Enc Vitals Group     BP 07/24/22 0903 (!) 159/94     Pulse Rate 07/24/22 0903 96     Resp 07/24/22 0903 18     Temp 07/24/22 0903 99.5 F (37.5 C)     Temp Source 07/24/22 0903 Oral     SpO2 07/24/22 0903 100 %     Weight 07/24/22 0903 175 lb (79.4 kg)     Height 07/24/22 0903 5\' 3"  (1.6 m)     Head Circumference --      Peak Flow --      Pain Score 07/24/22 0906 1     Pain Loc --      Pain Edu? --      Excl. in [place]? --     Most recent vital signs: Vitals:   07/24/22 0903  BP: (!) 159/94  Pulse: 96  Resp: 18  Temp: 99.5 F (37.5 C)  SpO2: 100%    Physical Exam Constitutional:      Appearance: She is well-developed.  HENT:     Head: Atraumatic.  Eyes:     Conjunctiva/sclera: Conjunctivae normal.  Cardiovascular:     Rate and Rhythm: Regular rhythm.  Pulmonary:     Effort: No respiratory distress.  Abdominal:     General: There is no distension.     Tenderness: There is no abdominal tenderness.  Musculoskeletal:        General: Normal range of motion.     Cervical back: Normal range of motion.  Skin:    General: Skin is warm.  Neurological:     Mental Status: She is alert. Mental status is at baseline.      IMPRESSION / MDM / ASSESSMENT  AND PLAN / ED COURSE  I reviewed the triage vital signs and the nursing notes.  Differential diagnosis including dehydration, electrolyte abnormality, viral illness secondary to COVID, diverticulitis   Labs (all labs ordered are listed, but only abnormal results are displayed) Labs interpreted as -   Lab work with mild leukocytosis.  Creatinine appears to be at her baseline.  No significant electrolyte abnormalities.  UA with bacteria but no other signs of urinary tract infection.  Patient without symptoms of urinary tract infection.  No signs of pancreatitis.  Patient without significant left lower quadrant abdominal tenderness to palpation and no rebound or guarding.  Have a low suspicion for acute diverticulitis.  Most likely having diarrhea secondary to her COVID/viral illness.  Also had started Paxlovid which likely caused some of her symptoms of diarrhea however she has since discontinued this medication.  Labs Reviewed  COMPREHENSIVE METABOLIC PANEL - Abnormal; Notable for the following components:      Result  Value   Glucose, Bld 112 (*)    BUN 24 (*)    Total Protein 8.6 (*)    All other components within normal limits  CBC - Abnormal; Notable for the following components:   WBC 12.2 (*)    Platelets 441 (*)    All other components within normal limits  URINALYSIS, ROUTINE W REFLEX MICROSCOPIC - Abnormal; Notable for the following components:   Color, Urine YELLOW (*)    APPearance HAZY (*)    Hgb urine dipstick MODERATE (*)    Bacteria, UA RARE (*)    All other components within normal limits  LIPASE, BLOOD    Discussed close follow-up with primary care physician.  Patient without any other risk factors concerning for C. difficile.  Discussed oral hydration.  And returning for signs of dehydration.     PROCEDURES:  Critical Care performed: No  Procedures  Patient's presentation is most consistent with acute presentation with potential threat to life or bodily  function.   MEDICATIONS ORDERED IN ED: Medications - No data to display  FINAL CLINICAL IMPRESSION(S) / ED DIAGNOSES   Final diagnoses:  Generalized abdominal pain  Diarrhea, unspecified type     Rx / DC Orders   ED Discharge Orders     None        Note:  This document was prepared using Dragon voice recognition software and may include unintentional dictation errors.   Corena Herter, MD 07/24/22 1515

## 2022-07-24 NOTE — ED Triage Notes (Signed)
Pt states coming in with a recent covid diagnosis and now has abdominal cramping and diarrhea.

## 2022-11-15 ENCOUNTER — Other Ambulatory Visit: Payer: Self-pay

## 2022-11-15 ENCOUNTER — Emergency Department
Admission: EM | Admit: 2022-11-15 | Discharge: 2022-11-15 | Disposition: A | Discharge status: Home / Self Care | Payer: BC Managed Care – PPO | Attending: Emergency Medicine | Admitting: Emergency Medicine

## 2022-11-15 DIAGNOSIS — M25512 Pain in left shoulder: Secondary | ICD-10-CM | POA: Diagnosis present

## 2022-11-15 DIAGNOSIS — I10 Essential (primary) hypertension: Secondary | ICD-10-CM | POA: Insufficient documentation

## 2022-11-15 DIAGNOSIS — N611 Abscess of the breast and nipple: Secondary | ICD-10-CM | POA: Diagnosis not present

## 2022-11-15 DIAGNOSIS — M7552 Bursitis of left shoulder: Secondary | ICD-10-CM

## 2022-11-15 MED ORDER — MELOXICAM 15 MG PO TABS
15.0000 mg | ORAL_TABLET | Freq: Every day | ORAL | 2 refills | Status: AC
Start: 2022-11-15 — End: 2023-11-15

## 2022-11-15 MED ORDER — PREDNISONE 10 MG (21) PO TBPK: ORAL_TABLET | ORAL | 0 refills | Status: DC

## 2022-11-15 MED ORDER — CEPHALEXIN 500 MG PO CAPS: 500.0000 mg | ORAL_CAPSULE | Freq: Three times a day (TID) | ORAL | 0 refills | Status: AC

## 2022-11-15 NOTE — ED Triage Notes (Signed)
Pt states left shoulder pain on and off for the last several days. Pt states it was diagnosed as bursitis or arthritis. Pt states an abscess under her left breast she would like to be checked, she feels like it is healing though. Pt states elevated BP due to not taking her morning BP medication.

## 2022-11-15 NOTE — Discharge Instructions (Addendum)
Follow-up with your regular doctor if not improving in 5 to 7 days.  Return emergency department worsening.  You can also see orthopedics, [facility] as a walk-in clinic.  Or see [facility] orthopedics since Dr. Judithann Sheen is your regular doctor.  Apply ice.  The Biofreeze you can still use.  Take the antibiotic for the breast abscess.  If this does not clear up with antibiotic please see your doctor

## 2022-11-15 NOTE — ED Notes (Signed)
61 yof with a c/c of left sided shoulder pain for the past week. The pt also advised she had a cyst under left breast she wanted to have it checked out too. The pt was ambulatory to the room.

## 2022-11-15 NOTE — ED Provider Notes (Signed)
[facility] Provider Note    Event Date/Time   First MD Initiated Contact with Patient 11/15/22 1104     (approximate)   History   Shoulder Pain   HPI  CHIA MOWERS is a 62 y.o. female with history of hypertension, hyperlipidemia, bursitis in the left shoulder presents emergency department complaint of left shoulder pain.  Patient denies chest pain or shortness of breath.  Pain strictly with movement.  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  States her regular doctor had given her prednisone when this happened before.  States that that really helped.  Also has a abscess under her left breast that is already popped and is draining but she is afraid it might need an antibiotic.  Denies fever or chills      Physical Exam   Triage Vital Signs: ED Triage Vitals  Enc Vitals Group     BP 11/15/22 1008 (!) 175/87     Pulse Rate 11/15/22 1008 81     Resp 11/15/22 1008 17     Temp 11/15/22 1008 98.5 F (36.9 C)     Temp Source 11/15/22 1008 Oral     SpO2 11/15/22 1008 99 %     Weight 11/15/22 1010 175 lb (79.4 kg)     Height 11/15/22 1010 5\' 3"  (1.6 m)     Head Circumference --      Peak Flow --      Pain Score 11/15/22 1009 0     Pain Loc --      Pain Edu? --      Excl. in [place]? --     Most recent vital signs: Vitals:   11/15/22 1008  BP: (!) 175/87  Pulse: 81  Resp: 17  Temp: 98.5 F (36.9 C)  SpO2: 99%     General: Awake, no distress.   CV:  Good peripheral perfusion. regular rate and  rhythm Resp:  Normal effort. Lungs cta Abd:  No distention.   Other:  Left breast has a small abscess noted underneath around 6:00, nonfluctuant, no drainage noted at this time, left shoulder is tender to palpation at the joint and along the bicep tendon, pain is reproduced with overhead reach, does have full range of motion, neurovascular intact   ED Results / Procedures / Treatments   Labs (all labs  ordered are listed, but only abnormal results are displayed) Labs Reviewed - No data to display   EKG     RADIOLOGY     PROCEDURES:   Procedures   MEDICATIONS ORDERED IN ED: Medications - No data to display   IMPRESSION / MDM / ASSESSMENT AND PLAN / ED COURSE  I reviewed the triage vital signs and the nursing notes.                              Differential diagnosis includes, but is not limited to, bursitis, arthritis,, tendinitis, abscess, cellulitis  Patient's presentation is most consistent with acute illness / injury with system symptoms.   I did explain all the findings with patient.  Feel that she needs further workup she is already had an x-ray of the shoulder.  Patient's symptoms were resolved prednisone last time.  Will give her a Sterapred Dosepak, meloxicam to start taking after the prednisone, and Keflex for the abscess.  Patient is in agreement treatment plan.  She was encouraged to  follow-up with orthopedics.  Follow-up with your regular doctor for breast abscess.  Patient does have a mammogram in June.  Discharged in stable condition.      FINAL CLINICAL IMPRESSION(S) / ED DIAGNOSES   Final diagnoses:  Bursitis of left shoulder  Abscess of breast     Rx / DC Orders   ED Discharge Orders          Ordered    meloxicam (MOBIC) 15 MG tablet  Daily        11/15/22 1117    predniSONE (STERAPRED UNI-PAK 21 TAB) 10 MG (21) TBPK tablet        11/15/22 1117    cephALEXin (KEFLEX) 500 MG capsule  3 times daily        11/15/22 1117             Note:  This document was prepared using Dragon voice recognition software and may include unintentional dictation errors.    Faythe Ghee, PA-C 11/15/22 1126    Phineas Semen, MD 11/15/22 [phone]

## 2023-04-04 ENCOUNTER — Emergency Department
Admission: EM | Admit: 2023-04-04 | Discharge: 2023-04-04 | Disposition: A | Discharge status: Home / Self Care | Payer: 59 | Attending: Emergency Medicine | Admitting: Emergency Medicine

## 2023-04-04 ENCOUNTER — Other Ambulatory Visit: Payer: Self-pay

## 2023-04-04 DIAGNOSIS — A084 Viral intestinal infection, unspecified: Secondary | ICD-10-CM | POA: Diagnosis not present

## 2023-04-04 DIAGNOSIS — D72829 Elevated white blood cell count, unspecified: Secondary | ICD-10-CM | POA: Insufficient documentation

## 2023-04-04 DIAGNOSIS — R197 Diarrhea, unspecified: Secondary | ICD-10-CM | POA: Diagnosis present

## 2023-04-04 DIAGNOSIS — K219 Gastro-esophageal reflux disease without esophagitis: Secondary | ICD-10-CM | POA: Insufficient documentation

## 2023-04-04 DIAGNOSIS — I10 Essential (primary) hypertension: Secondary | ICD-10-CM | POA: Diagnosis not present

## 2023-04-04 LAB — COMPREHENSIVE METABOLIC PANEL
ALT: 16 U/L (ref 0–44)
AST: 17 U/L (ref 15–41)
Albumin: 4.2 g/dL (ref 3.5–5.0)
Alkaline Phosphatase: 72 U/L (ref 38–126)
Anion gap: 13 (ref 5–15)
BUN: 21 mg/dL (ref 8–23)
CO2: 23 mmol/L (ref 22–32)
Calcium: 9.8 mg/dL (ref 8.9–10.3)
Chloride: 101 mmol/L (ref 98–111)
Creatinine, Ser: 0.88 mg/dL (ref 0.44–1.00)
GFR, Estimated: >60 mL/min (ref >60)
Glucose, Bld: 109 mg/dL — ABNORMAL HIGH (ref 70–99)
Potassium: 3 mmol/L — ABNORMAL LOW (ref 3.5–5.1)
Sodium: 137 mmol/L (ref 135–145)
Total Bilirubin: 0.5 mg/dL (ref 0.3–1.2)
Total Protein: 8.2 g/dL — ABNORMAL HIGH (ref 6.5–8.1)

## 2023-04-04 LAB — URINALYSIS, ROUTINE W REFLEX MICROSCOPIC
Bilirubin Urine: NEGATIVE
Glucose, UA: NEGATIVE mg/dL
Ketones, ur: NEGATIVE mg/dL
Leukocytes,Ua: NEGATIVE
Nitrite: NEGATIVE
Protein, ur: NEGATIVE mg/dL
Specific Gravity, Urine: 1.02 (ref 1.005–1.030)
pH: 5 (ref 5.0–8.0)

## 2023-04-04 LAB — CBC
HCT: 38.3 % (ref 36.0–46.0)
Hemoglobin: 13.1 g/dL (ref 12.0–15.0)
MCH: 30.1 pg (ref 26.0–34.0)
MCHC: 34.2 g/dL (ref 30.0–36.0)
MCV: 88 fL (ref 80.0–100.0)
Platelets: 412 10*3/uL — ABNORMAL HIGH (ref 150–400)
RBC: 4.35 MIL/uL (ref 3.87–5.11)
RDW: 14.5 % (ref 11.5–15.5)
WBC: 16.1 10*3/uL — ABNORMAL HIGH (ref 4.0–10.5)
nRBC: 0 % (ref 0.0–0.2)

## 2023-04-04 LAB — LIPASE, BLOOD: Lipase: 23 U/L (ref 11–51)

## 2023-04-04 MED ORDER — POTASSIUM CHLORIDE CRYS ER 20 MEQ PO TBCR
60.0000 meq | EXTENDED_RELEASE_TABLET | Freq: Once | ORAL | Status: AC
  Administered 2023-04-04: 60 meq via ORAL
  Filled 2023-04-04: qty 3

## 2023-04-04 MED ORDER — LOPERAMIDE HCL 2 MG PO CAPS
4.0000 mg | ORAL_CAPSULE | Freq: Once | ORAL | Status: AC
  Administered 2023-04-04: 4 mg via ORAL
  Filled 2023-04-04: qty 2

## 2023-04-04 NOTE — ED Notes (Signed)
See triage note  Presents with some diarrhea  states this started couple of days ago  denies any n/v or fever

## 2023-04-04 NOTE — ED Provider Notes (Signed)
[facility] Provider Note    Event Date/Time   First MD Initiated Contact with Patient 04/04/23 [phone]     (approximate)   History   Chief Complaint: Diarrhea   HPI  Carla [name] is a 62 y.o. female  with h/o gerd, htn who comes to the ED c/o diarrhea x 3 days that started after eating [employer] hotdogs. Has vague abd discomfort, nonradiating. No fever. Diarrhea is NB/NB, watery without mucus. No vomiting.       Physical Exam   Triage Vital Signs: ED Triage Vitals  Encounter Vitals Group     BP 04/04/23 0742 (!) 158/77     Systolic BP Percentile --      Diastolic BP Percentile --      Pulse Rate 04/04/23 0742 70     Resp 04/04/23 0742 18     Temp 04/04/23 0742 98.4 F (36.9 C)     Temp Source 04/04/23 0742 Oral     SpO2 04/04/23 0742 100 %     Weight --      Height --      Head Circumference --      Peak Flow --      Pain Score 04/04/23 0744 9     Pain Loc --      Pain Education --      Exclude from Growth Chart --     Most recent vital signs: Vitals:   04/04/23 0742  BP: (!) 158/77  Pulse: 70  Resp: 18  Temp: 98.4 F (36.9 C)  SpO2: 100%    General: Awake, no distress.  CV:  Good peripheral perfusion.  Resp:  Normal effort.  Abd:  No distention. Soft nt Other:  MMM   ED Results / Procedures / Treatments   Labs (all labs ordered are listed, but only abnormal results are displayed) Labs Reviewed  COMPREHENSIVE METABOLIC PANEL - Abnormal; Notable for the following components:      Result Value   Potassium 3.0 (*)    Glucose, Bld 109 (*)    Total Protein 8.2 (*)    All other components within normal limits  CBC - Abnormal; Notable for the following components:   WBC 16.1 (*)    Platelets 412 (*)    All other components within normal limits  URINALYSIS, ROUTINE W REFLEX MICROSCOPIC - Abnormal; Notable for the following components:   Color, Urine YELLOW (*)    APPearance CLOUDY (*)    Hgb urine dipstick MODERATE (*)     Bacteria, UA FEW (*)    All other components within normal limits  LIPASE, BLOOD     EKG    RADIOLOGY    PROCEDURES:  Procedures   MEDICATIONS ORDERED IN ED: Medications  potassium chloride SA (KLOR-CON M) CR tablet 60 mEq (has no administration in time range)  loperamide (IMODIUM) capsule 4 mg (4 mg Oral Given 04/04/23 0837)     IMPRESSION / MDM / ASSESSMENT AND PLAN / ED COURSE  I reviewed the triage vital signs and the nursing notes.  DDx: viral AGE, electrolyte imbalance, AKI, UTI  Patient's presentation is most consistent with acute presentation with potential threat to life or bodily function.  Pt p/w likely food-borne illness / viral AGE.  Considering the patient's symptoms, medical history, and physical examination today, I have low suspicion for cholecystitis or biliary pathology, pancreatitis, perforation or bowel obstruction, hernia, intra-abdominal abscess, AAA or dissection, volvulus or intussusception, mesenteric ischemia, or appendicitis.  Will give immodium and check labs.  Low risk for HUS.   ----------------------------------------- 9:27 AM on 04/04/2023 ----------------------------------------- Labs all reassuring.  Mild leukocytosis which is attributable to viral illness.  Stable for discharge      FINAL CLINICAL IMPRESSION(S) / ED DIAGNOSES   Final diagnoses:  Viral gastroenteritis     Rx / DC Orders   ED Discharge Orders     None        Note:  This document was prepared using Dragon voice recognition software and may include unintentional dictation errors.   Sharman Cheek, MD 04/04/23 [phone]

## 2023-04-04 NOTE — ED Triage Notes (Signed)
Pt comes with c/o diarrhea since Saturday. Pt states this started after eating a hotdog. Pt states  little belly pain. Pt denies any vomiting.

## 2023-07-29 ENCOUNTER — Emergency Department
Admission: EM | Admit: 2023-07-29 | Discharge: 2023-07-29 | Disposition: A | Discharge status: Home / Self Care | Payer: BC Managed Care – PPO | Attending: Emergency Medicine | Admitting: Emergency Medicine

## 2023-07-29 DIAGNOSIS — A084 Viral intestinal infection, unspecified: Secondary | ICD-10-CM | POA: Insufficient documentation

## 2023-07-29 DIAGNOSIS — K2970 Gastritis, unspecified, without bleeding: Secondary | ICD-10-CM

## 2023-07-29 DIAGNOSIS — I2510 Atherosclerotic heart disease of native coronary artery without angina pectoris: Secondary | ICD-10-CM | POA: Diagnosis not present

## 2023-07-29 DIAGNOSIS — I10 Essential (primary) hypertension: Secondary | ICD-10-CM | POA: Diagnosis not present

## 2023-07-29 DIAGNOSIS — R109 Unspecified abdominal pain: Secondary | ICD-10-CM | POA: Diagnosis present

## 2023-07-29 LAB — COMPREHENSIVE METABOLIC PANEL
ALT: 19 U/L (ref 0–44)
AST: 23 U/L (ref 15–41)
Albumin: 4 g/dL (ref 3.5–5.0)
Alkaline Phosphatase: 86 U/L (ref 38–126)
Anion gap: 12 (ref 5–15)
BUN: 21 mg/dL (ref 8–23)
CO2: 20 mmol/L — ABNORMAL LOW (ref 22–32)
Calcium: 9.7 mg/dL (ref 8.9–10.3)
Chloride: 106 mmol/L (ref 98–111)
Creatinine, Ser: 0.97 mg/dL (ref 0.44–1.00)
GFR, Estimated: >60 mL/min (ref >60)
Glucose, Bld: 120 mg/dL — ABNORMAL HIGH (ref 70–99)
Potassium: 3.7 mmol/L (ref 3.5–5.1)
Sodium: 138 mmol/L (ref 135–145)
Total Bilirubin: 0.7 mg/dL (ref 0.0–1.2)
Total Protein: 8.3 g/dL — ABNORMAL HIGH (ref 6.5–8.1)

## 2023-07-29 LAB — URINALYSIS, ROUTINE W REFLEX MICROSCOPIC
Bilirubin Urine: NEGATIVE
Glucose, UA: NEGATIVE mg/dL
Ketones, ur: NEGATIVE mg/dL
Leukocytes,Ua: NEGATIVE
Nitrite: NEGATIVE
Protein, ur: NEGATIVE mg/dL
Specific Gravity, Urine: 1.016 (ref 1.005–1.030)
pH: 5 (ref 5.0–8.0)

## 2023-07-29 LAB — CBC
HCT: 42.5 % (ref 36.0–46.0)
Hemoglobin: 14.1 g/dL (ref 12.0–15.0)
MCH: 30.6 pg (ref 26.0–34.0)
MCHC: 33.2 g/dL (ref 30.0–36.0)
MCV: 92.2 fL (ref 80.0–100.0)
Platelets: 450 10*3/uL — ABNORMAL HIGH (ref 150–400)
RBC: 4.61 MIL/uL (ref 3.87–5.11)
RDW: 14.1 % (ref 11.5–15.5)
WBC: 15.7 10*3/uL — ABNORMAL HIGH (ref 4.0–10.5)
nRBC: 0 % (ref 0.0–0.2)

## 2023-07-29 LAB — LIPASE, BLOOD: Lipase: 25 U/L (ref 11–51)

## 2023-07-29 MED ORDER — DICYCLOMINE HCL 10 MG PO CAPS
10.0000 mg | ORAL_CAPSULE | Freq: Once | ORAL | Status: AC
  Administered 2023-07-29: 10 mg via ORAL
  Filled 2023-07-29: qty 1

## 2023-07-29 MED ORDER — ONDANSETRON HCL 4 MG PO TABS
4.0000 mg | ORAL_TABLET | Freq: Three times a day (TID) | ORAL | 0 refills | Status: AC | PRN
Start: 2023-07-29 — End: ?

## 2023-07-29 MED ORDER — ONDANSETRON 4 MG PO TBDP
4.0000 mg | ORAL_TABLET | Freq: Once | ORAL | Status: AC
  Administered 2023-07-29: 4 mg via ORAL
  Filled 2023-07-29: qty 1

## 2023-07-29 MED ORDER — DICYCLOMINE HCL 10 MG PO CAPS
10.0000 mg | ORAL_CAPSULE | Freq: Three times a day (TID) | ORAL | 0 refills | Status: DC
Start: 2023-07-29 — End: 2024-04-04

## 2023-07-29 NOTE — ED Provider Notes (Signed)
 [facility] Provider Note    Event Date/Time   First MD Initiated Contact with Patient 07/29/23 1032     (approximate)   History   Abdominal Pain and Diarrhea   HPI  Carla [name] is a 64 y.o. female  with history of CAD, hypertension, migraine and as listed in EMR presents to the emergency department for treatment and evaluation of diarrhea and abdominal cramping that started last night. She has also felt nauseated today. No fever or cough. Coworker has been sick.      Physical Exam   Triage Vital Signs: ED Triage Vitals  Encounter Vitals Group     BP 07/29/23 0957 (!) 148/81     Systolic BP Percentile --      Diastolic BP Percentile --      Pulse Rate 07/29/23 0957 98     Resp 07/29/23 0957 17     Temp 07/29/23 0957 99.1 F (37.3 C)     Temp Source 07/29/23 0957 Oral     SpO2 07/29/23 0957 99 %     Weight 07/29/23 1003 175 lb (79.4 kg)     Height 07/29/23 1003 5' 3 (1.6 m)     Head Circumference --      Peak Flow --      Pain Score 07/29/23 1002 5     Pain Loc --      Pain Education --      Exclude from Growth Chart --     Most recent vital signs: Vitals:   07/29/23 0957  BP: (!) 148/81  Pulse: 98  Resp: 17  Temp: 99.1 F (37.3 C)  SpO2: 99%    General: Awake, no distress.  CV:  Good peripheral perfusion.  Resp:  Normal effort.  Abd:  No distention. Soft. Bowel sounds present x 4 quadrants. Other:    ED Results / Procedures / Treatments   Labs (all labs ordered are listed, but only abnormal results are displayed) Labs Reviewed  COMPREHENSIVE METABOLIC PANEL - Abnormal; Notable for the following components:      Result Value   CO2 20 (*)    Glucose, Bld 120 (*)    Total Protein 8.3 (*)    All other components within normal limits  CBC - Abnormal; Notable for the following components:   WBC 15.7 (*)    Platelets 450 (*)    All other components within normal limits  URINALYSIS, ROUTINE W REFLEX MICROSCOPIC -  Abnormal; Notable for the following components:   Color, Urine YELLOW (*)    APPearance HAZY (*)    Hgb urine dipstick MODERATE (*)    Bacteria, UA RARE (*)    All other components within normal limits  LIPASE, BLOOD     EKG  Not indicated.   RADIOLOGY  Image and radiology report reviewed and interpreted by me. Radiology report consistent with the same.  Not indicated  PROCEDURES:  Critical Care performed: No  Procedures   MEDICATIONS ORDERED IN ED:  Medications  dicyclomine  (BENTYL ) capsule 10 mg (10 mg Oral Given 07/29/23 1108)  ondansetron  (ZOFRAN -ODT) disintegrating tablet 4 mg (4 mg Oral Given 07/29/23 1108)     IMPRESSION / MDM / ASSESSMENT AND PLAN / ED COURSE   I have reviewed the triage note.  Differential diagnosis includes, but is not limited to, gastroenteritis, colitis, diverticulitis.  Patient's presentation is most consistent with acute complicated illness / injury requiring diagnostic workup.  63 year old female presents with  abdominal cramping and diarrhea with occasional nausea. See HPI.  Labs are reassuring. Urinalysis is without indication of infection or dehydration. Bentyl  and zofran  given while here with improvement in symptoms. She will be discharged home with the same. Work note provided. ER return precautions discussed.  Clinical Course as of 07/29/23 1514  Sat Jul 29, 2023  1208 Urinalysis, Routine w reflex microscopic -Urine, Clean Catch [JW]  1220 Urinalysis, Routine w reflex microscopic -Urine, Clean Catch [JW]    Clinical Course User Index [JW] Manny Bread, Student-PA     FINAL CLINICAL IMPRESSION(S) / ED DIAGNOSES   Final diagnoses:  Viral gastritis     Rx / DC Orders   ED Discharge Orders          Ordered    dicyclomine  (BENTYL ) 10 MG capsule  3 times daily before meals & bedtime        07/29/23 1253    ondansetron  (ZOFRAN ) 4 MG tablet  Every 8 hours PRN        07/29/23 1253             Note:  This  document was prepared using Dragon voice recognition software and may include unintentional dictation errors.   Herlinda Kirk NOVAK, FNP 07/29/23 1514    Claudene Rover, MD 07/29/23 [phone]

## 2023-07-29 NOTE — ED Triage Notes (Signed)
 Pt c/o abdominal cramping and diarrhea since last night.  Pain score 5/10. Denies sick contacts and has not taken anything for symptoms.

## 2023-08-01 ENCOUNTER — Inpatient Hospital Stay
Admission: EM | Admit: 2023-08-01 | Discharge: 2023-08-03 | DRG: 683 | Disposition: A | Discharge status: Home / Self Care | Payer: 59 | Attending: Internal Medicine | Admitting: Internal Medicine

## 2023-08-01 ENCOUNTER — Other Ambulatory Visit: Payer: Self-pay

## 2023-08-01 DIAGNOSIS — I252 Old myocardial infarction: Secondary | ICD-10-CM

## 2023-08-01 DIAGNOSIS — E860 Dehydration: Secondary | ICD-10-CM

## 2023-08-01 DIAGNOSIS — Z90710 Acquired absence of both cervix and uterus: Secondary | ICD-10-CM

## 2023-08-01 DIAGNOSIS — N2889 Other specified disorders of kidney and ureter: Secondary | ICD-10-CM | POA: Diagnosis present

## 2023-08-01 DIAGNOSIS — Z7952 Long term (current) use of systemic steroids: Secondary | ICD-10-CM

## 2023-08-01 DIAGNOSIS — Z888 Allergy status to other drugs, medicaments and biological substances status: Secondary | ICD-10-CM

## 2023-08-01 DIAGNOSIS — E872 Acidosis, unspecified: Secondary | ICD-10-CM | POA: Diagnosis present

## 2023-08-01 DIAGNOSIS — I10 Essential (primary) hypertension: Secondary | ICD-10-CM | POA: Diagnosis present

## 2023-08-01 DIAGNOSIS — Z79899 Other long term (current) drug therapy: Secondary | ICD-10-CM

## 2023-08-01 DIAGNOSIS — A0811 Acute gastroenteropathy due to Norwalk agent: Secondary | ICD-10-CM

## 2023-08-01 DIAGNOSIS — E876 Hypokalemia: Secondary | ICD-10-CM | POA: Diagnosis present

## 2023-08-01 DIAGNOSIS — I2510 Atherosclerotic heart disease of native coronary artery without angina pectoris: Secondary | ICD-10-CM | POA: Diagnosis present

## 2023-08-01 DIAGNOSIS — Z791 Long term (current) use of non-steroidal anti-inflammatories (NSAID): Secondary | ICD-10-CM

## 2023-08-01 DIAGNOSIS — N179 Acute kidney failure, unspecified: Principal | ICD-10-CM

## 2023-08-01 DIAGNOSIS — F17210 Nicotine dependence, cigarettes, uncomplicated: Secondary | ICD-10-CM | POA: Diagnosis present

## 2023-08-01 DIAGNOSIS — Z7982 Long term (current) use of aspirin: Secondary | ICD-10-CM

## 2023-08-01 DIAGNOSIS — E785 Hyperlipidemia, unspecified: Secondary | ICD-10-CM | POA: Diagnosis present

## 2023-08-01 DIAGNOSIS — J449 Chronic obstructive pulmonary disease, unspecified: Secondary | ICD-10-CM | POA: Diagnosis present

## 2023-08-01 DIAGNOSIS — R197 Diarrhea, unspecified: Principal | ICD-10-CM | POA: Insufficient documentation

## 2023-08-01 LAB — GASTROINTESTINAL PANEL BY PCR, STOOL (REPLACES STOOL CULTURE)

## 2023-08-01 LAB — URINALYSIS, ROUTINE W REFLEX MICROSCOPIC
Bilirubin Urine: NEGATIVE
Glucose, UA: NEGATIVE mg/dL
Ketones, ur: NEGATIVE mg/dL
Leukocytes,Ua: NEGATIVE
Nitrite: NEGATIVE
Protein, ur: 30 mg/dL — AB
Specific Gravity, Urine: 1.014 (ref 1.005–1.030)
pH: 5 (ref 5.0–8.0)

## 2023-08-01 LAB — COMPREHENSIVE METABOLIC PANEL
ALT: 21 U/L (ref 0–44)
AST: 23 U/L (ref 15–41)
Albumin: 4.2 g/dL (ref 3.5–5.0)
Alkaline Phosphatase: 71 U/L (ref 38–126)
Anion gap: 16 — ABNORMAL HIGH (ref 5–15)
BUN: 50 mg/dL — ABNORMAL HIGH (ref 8–23)
CO2: 15 mmol/L — ABNORMAL LOW (ref 22–32)
Calcium: 9.3 mg/dL (ref 8.9–10.3)
Chloride: 104 mmol/L (ref 98–111)
Creatinine, Ser: 2.96 mg/dL — ABNORMAL HIGH (ref 0.44–1.00)
GFR, Estimated: 17 mL/min — ABNORMAL LOW (ref >60)
Glucose, Bld: 119 mg/dL — ABNORMAL HIGH (ref 70–99)
Potassium: 3 mmol/L — ABNORMAL LOW (ref 3.5–5.1)
Sodium: 135 mmol/L (ref 135–145)
Total Bilirubin: 0.5 mg/dL (ref 0.0–1.2)
Total Protein: 8.3 g/dL — ABNORMAL HIGH (ref 6.5–8.1)

## 2023-08-01 LAB — MAGNESIUM: Magnesium: 2 mg/dL (ref 1.7–2.4)

## 2023-08-01 LAB — CBC
HCT: 40.7 % (ref 36.0–46.0)
Hemoglobin: 13.8 g/dL (ref 12.0–15.0)
MCH: 29.9 pg (ref 26.0–34.0)
MCHC: 33.9 g/dL (ref 30.0–36.0)
MCV: 88.3 fL (ref 80.0–100.0)
Platelets: 460 10*3/uL — ABNORMAL HIGH (ref 150–400)
RBC: 4.61 MIL/uL (ref 3.87–5.11)
RDW: 13.6 % (ref 11.5–15.5)
WBC: 12 10*3/uL — ABNORMAL HIGH (ref 4.0–10.5)
nRBC: 0 % (ref 0.0–0.2)

## 2023-08-01 LAB — LIPASE, BLOOD: Lipase: 30 U/L (ref 11–51)

## 2023-08-01 LAB — C DIFFICILE QUICK SCREEN W PCR REFLEX
C Diff antigen: NEGATIVE
C Diff interpretation: NOT DETECTED
C Diff toxin: NEGATIVE

## 2023-08-01 MED ORDER — SODIUM CHLORIDE 0.45 % IV SOLN
INTRAVENOUS | Status: AC
  Filled 2023-08-01 (×3): qty 75

## 2023-08-01 MED ORDER — ASPIRIN 81 MG PO TBEC
81.0000 mg | DELAYED_RELEASE_TABLET | Freq: Every day | ORAL | Status: DC
  Administered 2023-08-01 – 2023-08-02 (×2): 81 mg via ORAL
  Filled 2023-08-01 (×2): qty 1

## 2023-08-01 MED ORDER — RISAQUAD PO CAPS
1.0000 | ORAL_CAPSULE | Freq: Every day | ORAL | Status: DC
  Administered 2023-08-01 – 2023-08-02 (×2): 1 via ORAL
  Filled 2023-08-01 (×2): qty 1

## 2023-08-01 MED ORDER — POTASSIUM CHLORIDE CRYS ER 20 MEQ PO TBCR
40.0000 meq | EXTENDED_RELEASE_TABLET | ORAL | Status: AC
  Administered 2023-08-01 (×2): 40 meq via ORAL
  Filled 2023-08-01 (×2): qty 2

## 2023-08-01 MED ORDER — HYDRALAZINE HCL 50 MG PO TABS: 25.0000 mg | ORAL_TABLET | Freq: Four times a day (QID) | ORAL | Status: DC | PRN

## 2023-08-01 MED ORDER — BACID PO TABS
2.0000 | ORAL_TABLET | Freq: Three times a day (TID) | ORAL | Status: DC
Start: 2023-08-01 — End: 2023-08-01

## 2023-08-01 MED ORDER — SODIUM CHLORIDE 0.9 % IV BOLUS
1000.0000 mL | Freq: Once | INTRAVENOUS | Status: AC
  Administered 2023-08-01: 1000 mL via INTRAVENOUS

## 2023-08-01 MED ORDER — LOPERAMIDE HCL 2 MG PO CAPS: 2.0000 mg | ORAL_CAPSULE | ORAL | Status: DC | PRN

## 2023-08-01 MED ORDER — ONDANSETRON HCL 4 MG/2ML IJ SOLN: 4.0000 mg | Freq: Four times a day (QID) | INTRAMUSCULAR | Status: DC | PRN

## 2023-08-01 NOTE — ED Provider Triage Note (Signed)
 Emergency Medicine Provider Triage Evaluation Note  Carla [name] , a 63 y.o. female  was evaluated in triage.  Pt complains of continued diarrhea. Seen in ED 1/11 with the same and states was given medication that helped.   No other family members with same.    Review of Systems  Positive: Diarrhea, decreased appetite, weakness.  Negative: No vomiting, fever or chills  Physical Exam  BP 100/73   Pulse 68   Temp 98.4 F (36.9 C)   Resp 17   Ht 5' 3 (1.6 m)   Wt 80 kg   SpO2 96%   BMI 31.24 kg/m  Gen:   Awake, no distress   Resp:  Normal effort  Lungs clear MSK:   Moves extremities without difficulty  Other:  Non tender abdomen.  BS present.   Medical Decision Making  Medically screening exam initiated at 10:32 AM.  Appropriate orders placed.  Carla [name] was informed that the remainder of the evaluation will be completed by another provider, this initial triage assessment does not replace that evaluation, and the importance of remaining in the ED until their evaluation is complete.     Saunders Shona CROME, PA-C 08/01/23 1035

## 2023-08-01 NOTE — ED Notes (Signed)
Unsuccessful phlebotomy attempt 

## 2023-08-01 NOTE — ED Triage Notes (Signed)
 Pt to ED for diarrhea started over the weekend. Denies fevers. Seen on 1/11 for same. Ambulatory, NAD noted. Denies pain

## 2023-08-01 NOTE — ED Provider Notes (Signed)
 [facility] Provider Note    Event Date/Time   First MD Initiated Contact with Patient 08/01/23 1535     (approximate)  History   Chief Complaint: Diarrhea  HPI  BRISEIDA GITTINGS is a 63 y.o. female with a past medical history of hypertension who presents to the emergency department for worsening diarrhea and generalized weakness.  According to the patient since Friday she has been experiencing nausea and diarrhea.  Patient was seen for the same here on Saturday given nausea medication.  Patient states she has been trying to hydrate but has been finding it very difficult.  States she has not been able to eat much of anything and has been feeling extremely weak so she came back to the emergency department today.  Denies any fever.  States nausea at first but denies any recent nausea or vomiting.  Physical Exam   Triage Vital Signs: ED Triage Vitals  Encounter Vitals Group     BP 08/01/23 1028 100/73     Systolic BP Percentile --      Diastolic BP Percentile --      Pulse Rate 08/01/23 1028 68     Resp 08/01/23 1028 17     Temp 08/01/23 1028 98.4 F (36.9 C)     Temp Source 08/01/23 1433 Oral     SpO2 08/01/23 1028 96 %     Weight 08/01/23 1029 176 lb 5.9 oz (80 kg)     Height 08/01/23 1029 5' 3 (1.6 m)     Head Circumference --      Peak Flow --      Pain Score 08/01/23 1029 0     Pain Loc --      Pain Education --      Exclude from Growth Chart --     Most recent vital signs: Vitals:   08/01/23 1028 08/01/23 1433  BP: 100/73 111/70  Pulse: 68 70  Resp: 17 18  Temp: 98.4 F (36.9 C) 97.7 F (36.5 C)  SpO2: 96% 99%    General: Awake, no distress.  CV:  Good peripheral perfusion.  Regular rate and rhythm  Resp:  Normal effort.  Equal breath sounds bilaterally.  Abd:  No distention.  Soft, nontender.  No rebound or guarding.  ED Results / Procedures / Treatments    MEDICATIONS ORDERED IN ED: Medications  sodium chloride  0.9 % bolus  1,000 mL (has no administration in time range)     IMPRESSION / MDM / ASSESSMENT AND PLAN / ED COURSE  I reviewed the triage vital signs and the nursing notes.  Patient's presentation is most consistent with acute presentation with potential threat to life or bodily function.  Patient presents to the emergency department for generalized weakness nausea and diarrhea.  Has been experiencing diarrhea over the last 4 to 5 days.  Patient's lab work today shows a reassuring CBC with a normal white blood cell count.  Patient's chemistry however shows a creatinine of 2.96 up from a baseline of normal as well as an anion gap of 16 consistent with dehydration.  Reassuringly normal LFTs and lipase.  Given the patient's increased creatinine with decreased GFR down to 17 we will IV hydrate the patient and admit to the hospital for further workup and treatment.  FINAL CLINICAL IMPRESSION(S) / ED DIAGNOSES   Dehydration Weakness Diarrhea Acute renal failure  Note:  This document was prepared using Dragon voice recognition software and may include unintentional dictation errors.  Dorothyann Drivers, MD 08/01/23 1550

## 2023-08-01 NOTE — H&P (Signed)
 History and Physical    Carla [name] FMW:[id number] DOB: 07-Sep-1960 DOA: 08/01/2023  PCP: Auston Reyes BIRCH, MD (Confirm with patient/family/NH records and if not entered, this has to be entered at [facility] point of entry) Patient coming from: Home  I have personally briefly reviewed patient's old medical records in [facility]  Chief Complaint: Diarrhea  HPI: Carla [name] is a 63 y.o. female with medical history significant of CAD, HTN, HLD, presented with persistent diarrhea.   Patient started to have a global headache Friday evening.  She woke up Saturday morning with cramping-like abdominal pain epigastric located associate with nausea and vomited x 1 of stomach content than in the morning, she started develop watery diarrhea, no mucus no blood no dark-colored material, denied any foul smell.  Denied any tenesmus, no fever or chills.  Came to ED for the first time Saturday morning, abdominal pain improved and nausea improved with as needed medications and patient was hydrated and sent home.  Sent home with as needed Bentyl  and Zofran .  Next day, nausea and cramping-like abdominal pain subsided but patient continued to experience watery diarrhea 5-10 times every day,  every time I tried to eat or drink.  Became very dehydrated.  Denies any sick contacts no recent travels. ED Course: Afebrile, blood pressure 100 nontachycardic not hypoxic.  Blood work showed WBC 12.0, BUN 50, creatinine 2.9, bicarb 15 anion gap 17, K3.0.  Patient was given IV bolus 1000 x 1 in the ED.  Review of Systems: As per HPI otherwise 14 point review of systems negative.    Past Medical History:  Diagnosis Date   Coronary artery disease    Hypertension    Myocardial infarct, old     Past Surgical History:  Procedure Laterality Date   ABDOMINAL HYSTERECTOMY     CARPAL TUNNEL RELEASE       reports that she has been smoking cigarettes. She has never used smokeless tobacco. She reports current alcohol  use. She reports that she does not use drugs.  Allergies  Allergen Reactions   Atorvastatin     Other reaction(s): Muscle Pain   Rosuvastatin     Other reaction(s): Muscle Pain    History reviewed. No pertinent family history.   Prior to Admission medications   Medication Sig Start Date End Date Taking? Authorizing Provider  aspirin  EC 81 MG tablet Take 1 tablet by mouth daily. 01/21/14   [provider]  dicyclomine  (BENTYL ) 10 MG capsule Take 1 capsule (10 mg total) by mouth 4 (four) times daily -  before meals and at bedtime. 07/29/23   Triplett, Kirk B, FNP  famotidine  (PEPCID ) 20 MG tablet Take 1 tablet (20 mg total) by mouth 2 (two) times daily for 7 days. 02/02/19 02/09/19  Triplett, Kirk B, FNP  hydrochlorothiazide (HYDRODIURIL) 12.5 MG tablet Take 1 tablet by mouth daily. 03/25/15   [provider]  meloxicam  (MOBIC ) 15 MG tablet Take 1 tablet (15 mg total) by mouth daily. 11/15/22 11/15/23  Fisher, Devere ORN, PA-C  metoprolol tartrate (LOPRESSOR) 25 MG tablet Take 25 mg by mouth daily.     [provider]  ondansetron  (ZOFRAN ) 4 MG tablet Take 1 tablet (4 mg total) by mouth every 8 (eight) hours as needed for nausea or vomiting. 07/29/23   Triplett, Cari B, FNP  pantoprazole (PROTONIX) 40 MG tablet Take 1 tablet by mouth daily.    [provider]  potassium chloride  (KLOR-CON  M) 10 MEQ tablet Take 1  tablet (10 mEq total) by mouth daily for 5 days. 08/03/21 08/08/21  Antonetta Penne Ruth, PA  predniSONE  (STERAPRED UNI-PAK 21 TAB) 10 MG (21) TBPK tablet Take 6 pills on day one then decrease by 1 pill each day 11/15/22   Gasper Devere ORN, PA-C  sucralfate  (CARAFATE ) 1 g tablet Take 1 tablet (1 g total) by mouth 4 (four) times daily. 10/04/17 10/04/18  Trudy Dorn BRAVO, MD  telmisartan (MICARDIS) 80 MG tablet Take 80 mg by mouth daily. 07/03/17   [provider]    Physical Exam: Vitals:   08/01/23 1028 08/01/23 1029 08/01/23 1433  BP: 100/73   111/70  Pulse: 68  70  Resp: 17  18  Temp: 98.4 F (36.9 C)  97.7 F (36.5 C)  TempSrc:   Oral  SpO2: 96%  99%  Weight:  80 kg   Height:  5' 3 (1.6 m)     Constitutional: NAD, calm, comfortable Vitals:   08/01/23 1028 08/01/23 1029 08/01/23 1433  BP: 100/73  111/70  Pulse: 68  70  Resp: 17  18  Temp: 98.4 F (36.9 C)  97.7 F (36.5 C)  TempSrc:   Oral  SpO2: 96%  99%  Weight:  80 kg   Height:  5' 3 (1.6 m)    Eyes: PERRL, lids and conjunctivae normal ENMT: Mucous membranes are moist. Posterior pharynx clear of any exudate or lesions.Normal dentition.  Neck: normal, supple, no masses, no thyromegaly Respiratory: clear to auscultation bilaterally, no wheezing, no crackles. Normal respiratory effort. No accessory muscle use.  Cardiovascular: Regular rate and rhythm, no murmurs / rubs / gallops. No extremity edema. 2+ pedal pulses. No carotid bruits.  Abdomen: no tenderness, no masses palpated. No hepatosplenomegaly.  Increased bowel sounds on lower quadrants Musculoskeletal: no clubbing / cyanosis. No joint deformity upper and lower extremities. Good ROM, no contractures. Normal muscle tone.  Skin: no rashes, lesions, ulcers. No induration Neurologic: CN 2-12 grossly intact. Sensation intact, DTR normal. Strength 5/5 in all 4.  Psychiatric: Normal judgment and insight. Alert and oriented x 3. Normal mood.     Labs on Admission: I have personally reviewed following labs and imaging studies  CBC: Recent Labs  Lab 07/29/23 0957 08/01/23 1045  WBC 15.7* 12.0*  HGB 14.1 13.8  HCT 42.5 40.7  MCV 92.2 88.3  PLT 450* 460*   Basic Metabolic Panel: Recent Labs  Lab 07/29/23 0957 08/01/23 1045  NA 138 135  K 3.7 3.0*  CL 106 104  CO2 20* 15*  GLUCOSE 120* 119*  BUN 21 50*  CREATININE 0.97 2.96*  CALCIUM 9.7 9.3   GFR: Estimated Creatinine Clearance: 20 mL/min (A) (by C-G formula based on SCr of 2.96 mg/dL (H)). Liver Function Tests: Recent Labs  Lab  07/29/23 0957 08/01/23 1045  AST 23 23  ALT 19 21  ALKPHOS 86 71  BILITOT 0.7 0.5  PROT 8.3* 8.3*  ALBUMIN 4.0 4.2   Recent Labs  Lab 07/29/23 0957 08/01/23 1045  LIPASE 25 30   No results for input(s): AMMONIA in the last 168 hours. Coagulation Profile: No results for input(s): INR, PROTIME in the last 168 hours. Cardiac Enzymes: No results for input(s): CKTOTAL, CKMB, CKMBINDEX, TROPONINI in the last 168 hours. BNP (last 3 results) No results for input(s): PROBNP in the last 8760 hours. HbA1C: No results for input(s): HGBA1C in the last 72 hours. CBG: No results for input(s): GLUCAP in the last 168 hours. Lipid Profile: No results for  input(s): CHOL, HDL, LDLCALC, TRIG, CHOLHDL, LDLDIRECT in the last 72 hours. Thyroid  Function Tests: No results for input(s): TSH, T4TOTAL, FREET4, T3FREE, THYROIDAB in the last 72 hours. Anemia Panel: No results for input(s): VITAMINB12, FOLATE, FERRITIN, TIBC, IRON, RETICCTPCT in the last 72 hours. Urine analysis:    Component Value Date/Time   COLORURINE YELLOW (A) 07/29/2023 1112   APPEARANCEUR HAZY (A) 07/29/2023 1112   APPEARANCEUR Clear 01/19/2014 1200   LABSPEC 1.016 07/29/2023 1112   LABSPEC 1.004 01/19/2014 1200   PHURINE 5.0 07/29/2023 1112   GLUCOSEU NEGATIVE 07/29/2023 1112   GLUCOSEU Negative 01/19/2014 1200   HGBUR MODERATE (A) 07/29/2023 1112   BILIRUBINUR NEGATIVE 07/29/2023 1112   BILIRUBINUR Negative 01/19/2014 1200   KETONESUR NEGATIVE 07/29/2023 1112   PROTEINUR NEGATIVE 07/29/2023 1112   NITRITE NEGATIVE 07/29/2023 1112   LEUKOCYTESUR NEGATIVE 07/29/2023 1112   LEUKOCYTESUR Negative 01/19/2014 1200    Radiological Exams on Admission: No results found.  EKG: None  Assessment/Plan Principal Problem:   AKI (acute kidney injury) (HCC) Active Problems:   Diarrhea  (please populate well all problems here in Problem List. (For example, if patient is on  BP meds at home and you resume or decide to hold them, it is a problem that needs to be her. Same for CAD, COPD, HLD and so on)  AKI with anion gap and non-anion gap metabolic acidosis -Prerenal likely secondary to GI loss and volume contraction.  Hopefully not ATN -Continue IV fluid, switch to bicarb drip -Trend kidney functions  Acute diarrhea -Clinical presentation compatible with acute gastroenteritis with upper GI symptoms already improving.  C diff. screening test negative in the ED and GI pathogen pending. -Start as needed Imodium  -Probiotics  Leukocytosis -GI symptoms likely viral, low suspicion for bacterial infection and abdominal exam benign and no other systemic inflammatory signs, monitor off antibiotics  Hypokalemia -Secondary to GI loss from diarrhea -P.o. replacement, recheck K level tomorrow -Magnesium  level pending  HTN -Hold off home BP meds -Start PRN Hydralazine    DVT prophylaxis: SCD Code Status: Full code Family Communication: Significant other at bedside Disposition Plan: Expect less than 2 midnight hospital stay Consults called: None Admission status: Telemetry observation   Cort ONEIDA Mana MD [facility] Pager [phone]  08/01/2023, 5:14 PM

## 2023-08-02 DIAGNOSIS — J449 Chronic obstructive pulmonary disease, unspecified: Secondary | ICD-10-CM | POA: Diagnosis present

## 2023-08-02 DIAGNOSIS — E876 Hypokalemia: Secondary | ICD-10-CM

## 2023-08-02 DIAGNOSIS — I2510 Atherosclerotic heart disease of native coronary artery without angina pectoris: Secondary | ICD-10-CM | POA: Diagnosis present

## 2023-08-02 DIAGNOSIS — R197 Diarrhea, unspecified: Secondary | ICD-10-CM | POA: Diagnosis not present

## 2023-08-02 DIAGNOSIS — Z90710 Acquired absence of both cervix and uterus: Secondary | ICD-10-CM | POA: Diagnosis not present

## 2023-08-02 DIAGNOSIS — Z791 Long term (current) use of non-steroidal anti-inflammatories (NSAID): Secondary | ICD-10-CM | POA: Diagnosis not present

## 2023-08-02 DIAGNOSIS — Z888 Allergy status to other drugs, medicaments and biological substances status: Secondary | ICD-10-CM | POA: Diagnosis not present

## 2023-08-02 DIAGNOSIS — Z7952 Long term (current) use of systemic steroids: Secondary | ICD-10-CM | POA: Diagnosis not present

## 2023-08-02 DIAGNOSIS — Z7982 Long term (current) use of aspirin: Secondary | ICD-10-CM | POA: Diagnosis not present

## 2023-08-02 DIAGNOSIS — Z79899 Other long term (current) drug therapy: Secondary | ICD-10-CM | POA: Diagnosis not present

## 2023-08-02 DIAGNOSIS — A09 Infectious gastroenteritis and colitis, unspecified: Secondary | ICD-10-CM | POA: Diagnosis not present

## 2023-08-02 DIAGNOSIS — E785 Hyperlipidemia, unspecified: Secondary | ICD-10-CM | POA: Diagnosis present

## 2023-08-02 DIAGNOSIS — E872 Acidosis, unspecified: Secondary | ICD-10-CM | POA: Diagnosis present

## 2023-08-02 DIAGNOSIS — I252 Old myocardial infarction: Secondary | ICD-10-CM | POA: Diagnosis not present

## 2023-08-02 DIAGNOSIS — E860 Dehydration: Secondary | ICD-10-CM | POA: Diagnosis present

## 2023-08-02 DIAGNOSIS — F17210 Nicotine dependence, cigarettes, uncomplicated: Secondary | ICD-10-CM | POA: Diagnosis present

## 2023-08-02 DIAGNOSIS — N2889 Other specified disorders of kidney and ureter: Secondary | ICD-10-CM | POA: Diagnosis present

## 2023-08-02 DIAGNOSIS — A0811 Acute gastroenteropathy due to Norwalk agent: Secondary | ICD-10-CM | POA: Diagnosis present

## 2023-08-02 DIAGNOSIS — N179 Acute kidney failure, unspecified: Secondary | ICD-10-CM | POA: Diagnosis present

## 2023-08-02 DIAGNOSIS — I10 Essential (primary) hypertension: Secondary | ICD-10-CM | POA: Diagnosis present

## 2023-08-02 LAB — CBC
HCT: 35.9 % — ABNORMAL LOW (ref 36.0–46.0)
Hemoglobin: 12.2 g/dL (ref 12.0–15.0)
MCH: 30.3 pg (ref 26.0–34.0)
MCHC: 34 g/dL (ref 30.0–36.0)
MCV: 89.3 fL (ref 80.0–100.0)
Platelets: 355 10*3/uL (ref 150–400)
RBC: 4.02 MIL/uL (ref 3.87–5.11)
RDW: 13.7 % (ref 11.5–15.5)
WBC: 10.1 10*3/uL (ref 4.0–10.5)
nRBC: 0 % (ref 0.0–0.2)

## 2023-08-02 LAB — BASIC METABOLIC PANEL
Anion gap: 12 (ref 5–15)
BUN: 48 mg/dL — ABNORMAL HIGH (ref 8–23)
CO2: 20 mmol/L — ABNORMAL LOW (ref 22–32)
Calcium: 8.3 mg/dL — ABNORMAL LOW (ref 8.9–10.3)
Chloride: 105 mmol/L (ref 98–111)
Creatinine, Ser: 2.2 mg/dL — ABNORMAL HIGH (ref 0.44–1.00)
GFR, Estimated: 25 mL/min — ABNORMAL LOW (ref >60)
Glucose, Bld: 101 mg/dL — ABNORMAL HIGH (ref 70–99)
Potassium: 3.1 mmol/L — ABNORMAL LOW (ref 3.5–5.1)
Sodium: 137 mmol/L (ref 135–145)

## 2023-08-02 LAB — HIV ANTIBODY (ROUTINE TESTING W REFLEX): HIV Screen 4th Generation wRfx: NONREACTIVE

## 2023-08-02 MED ORDER — POTASSIUM CHLORIDE CRYS ER 20 MEQ PO TBCR
40.0000 meq | EXTENDED_RELEASE_TABLET | Freq: Once | ORAL | Status: AC
  Administered 2023-08-02: 40 meq via ORAL
  Filled 2023-08-02: qty 2

## 2023-08-02 NOTE — ED Notes (Signed)
 Ambulatory to restroom

## 2023-08-02 NOTE — ED Notes (Signed)
 Pt up to restroom from stretcher

## 2023-08-02 NOTE — ED Notes (Signed)
 Pt up to restroom from stretcher pt provided [place] tray per request pt aox4 appears in nad

## 2023-08-02 NOTE — ED Notes (Addendum)
 Pt handoff received. Remains on full monitoring. Pt ambulates to the bedside toilet w/ steady gait, enteric precautions maintained. Pt updated on plan of care, verbalized understanding. Pending inpt bed.

## 2023-08-02 NOTE — Progress Notes (Signed)
 1      PROGRESS NOTE    Carla [name]  WUJ:[id number] DOB: 04/04/1961 DOA: 08/01/2023 PCP: Yehuda Helms, MD    Brief Narrative:   63 y.o. female with medical history significant of CAD, HTN, HLD, presented with persistent diarrhea and found to have AKI  1/15: Stool study positive for norovirus   Assessment & Plan:   Principal Problem:   AKI (acute kidney injury) (HCC) Active Problems:   Diarrhea   AKI with anion gap and non-anion gap metabolic acidosis -Prerenal likely secondary to GI loss and volume contraction.  Improving with hydration -Continue IV fluid, encourage oral nutrition -Trend kidney functions   Acute diarrhea due to renal mass -Clinical presentation compatible with acute gastroenteritis with upper GI symptoms already improving.  C diff. screening test negative in the ED and GI panel positive for norovirus -Continue as needed Imodium  -Probiotics   Leukocytosis -GI symptoms due to norovirus and abdominal exam benign and no other systemic inflammatory signs, monitor off antibiotics   Hypokalemia -Secondary to GI loss from diarrhea -Electrolyte consult for pharmacy   HTN -Hold off home BP meds -Continue PRN Hydralazine    DVT prophylaxis:  SCDs Start: 08/01/23 1621     Code Status: Full code Family Communication: No family at bedside "discussed with patient" Disposition Plan: Possible discharge in next 1 to 2 days depending on clinical condition    Subjective:  Diarrhea improving.  Still feeling weak  Objective: Vitals:   08/02/23 1021 08/02/23 1130 08/02/23 1400 08/02/23 1600  BP:  114/63 124/62 (!) 118/55  Pulse:  68 66 65  Resp:  18 18 15   Temp: 98.7 F (37.1 C)     TempSrc: Oral     SpO2:  100% 95% 98%  Weight:      Height:        Intake/Output Summary (Last 24 hours) at 08/02/2023 1644 Last data filed at 08/02/2023 1245 Gross per 24 hour  Intake 1856.05 ml  Output 2 ml  Net 1854.05 ml   Filed Weights   08/01/23 1029   Weight: 80 kg    Examination:  General exam: Appears calm and comfortable  Respiratory system: Clear to auscultation. Respiratory effort normal. Cardiovascular system: S1 & S2 heard, RRR. No JVD, murmurs, rubs, gallops or clicks. No pedal edema. Gastrointestinal system: Abdomen is nondistended, soft and nontender. No organomegaly or masses felt. Normal bowel sounds heard. Central nervous system: Alert and oriented. No focal neurological deficits. Extremities: Symmetric 5 x 5 power. Skin: No rashes, lesions or ulcers Psychiatry: Judgement and insight appear normal. Mood & affect appropriate.     Data Reviewed: I have personally reviewed following labs and imaging studies  CBC: Recent Labs  Lab 07/29/23 0957 08/01/23 1045 08/02/23 0309  WBC 15.7* 12.0* 10.1  HGB 14.1 13.8 12.2  HCT 42.5 40.7 35.9*  MCV 92.2 88.3 89.3  PLT 450* 460* 355   Basic Metabolic Panel: Recent Labs  Lab 07/29/23 0957 08/01/23 1045 08/02/23 0309  NA 138 135 137  K 3.7 3.0* 3.1*  CL 106 104 105  CO2 20* 15* 20*  GLUCOSE 120* 119* 101*  BUN 21 50* 48*  CREATININE 0.97 2.96* 2.20*  CALCIUM 9.7 9.3 8.3*  MG  --  2.0  --    GFR: Estimated Creatinine Clearance: 26.9 mL/min (A) (by C-G formula based on SCr of 2.2 mg/dL (H)). Liver Function Tests: Recent Labs  Lab 07/29/23 0957 08/01/23 1045  AST 23 23  ALT 19 21  ALKPHOS 86 71  BILITOT 0.7 0.5  PROT 8.3* 8.3*  ALBUMIN 4.0 4.2   Recent Labs  Lab 07/29/23 0957 08/01/23 1045  LIPASE 25 30     Recent Results (from the past 240 hours)  Gastrointestinal Panel by PCR , Stool     Status: Abnormal   Collection Time: 08/01/23  4:01 PM   Specimen: Stool  Result Value Ref Range Status   Campylobacter species NOT DETECTED NOT DETECTED Final   Plesimonas shigelloides NOT DETECTED NOT DETECTED Final   Salmonella species NOT DETECTED NOT DETECTED Final   Yersinia enterocolitica NOT DETECTED NOT DETECTED Final   Vibrio species NOT DETECTED  NOT DETECTED Final   Vibrio cholerae NOT DETECTED NOT DETECTED Final   Enteroaggregative E coli (EAEC) NOT DETECTED NOT DETECTED Final   Enteropathogenic E coli (EPEC) NOT DETECTED NOT DETECTED Final   Enterotoxigenic E coli (ETEC) NOT DETECTED NOT DETECTED Final   Shiga like toxin producing E coli (STEC) NOT DETECTED NOT DETECTED Final   Shigella/Enteroinvasive E coli (EIEC) NOT DETECTED NOT DETECTED Final   Cryptosporidium NOT DETECTED NOT DETECTED Final   Cyclospora cayetanensis NOT DETECTED NOT DETECTED Final   Entamoeba histolytica NOT DETECTED NOT DETECTED Final   Giardia lamblia NOT DETECTED NOT DETECTED Final   Adenovirus F40/41 NOT DETECTED NOT DETECTED Final   Astrovirus NOT DETECTED NOT DETECTED Final   Norovirus GI/GII DETECTED (A) NOT DETECTED Final    Comment: RESULT CALLED TO, READ BACK BY AND VERIFIED WITH: GINNY KNOWLES AT 1852 ON 08/01/23 BY SS    Rotavirus A NOT DETECTED NOT DETECTED Final   Sapovirus (I, II, IV, and V) NOT DETECTED NOT DETECTED Final    Comment: Performed at [facility], [address] [place], [place] [address]  C Difficile Quick Screen w PCR reflex     Status: None   Collection Time: 08/01/23  4:01 PM   Specimen: Stool  Result Value Ref Range Status   C Diff antigen NEGATIVE NEGATIVE Final   C Diff toxin NEGATIVE NEGATIVE Final   C Diff interpretation No C. difficile detected.  Final    Comment: Performed at [facility], [address], [place], [place] [address]         Radiology Studies: No results found.      Scheduled Meds:  acidophilus  1 capsule Oral Daily   aspirin  EC  81 mg Oral Daily   Continuous Infusions:   LOS: 0 days    Time spent: 35 minutes    Brenna Cam, MD [facility] Pager [phone]  If 7PM-7AM, please contact night-coverage [email or web address] Password [facility] 08/02/2023, 4:44 PM

## 2023-08-02 NOTE — Progress Notes (Addendum)
 PHARMACY CONSULT NOTE - FOLLOW UP  Pharmacy Consult for Electrolyte Monitoring and Replacement   Recent Labs: Potassium (mmol/L)  Date Value  08/02/2023 3.1 (L)  01/20/2014 3.8   Magnesium  (mg/dL)  Date Value  40/98/1191 2.0   Calcium (mg/dL)  Date Value  47/82/9562 8.3 (L)   Calcium, Total (mg/dL)  Date Value  13/02/6577 9.1   Albumin (g/dL)  Date Value  46/96/2952 4.2  01/20/2014 3.3 (L)   Sodium (mmol/L)  Date Value  08/02/2023 137  01/20/2014 139    Diet: Heart Fluid Fluids: sodium bicarbonate  75 mEq @ 125 mL/hr Pertinent Medications: None at this time  Assessment: TH is a 63 yo female who presented with a global headache Friday evening. She work up with cramping like abdominal pain with nausea and vomiting. Came to the ED Saturday and was sent home with Zofran  and Bentyl . Continued to experience diarrhea, nausea/vomiting. Returned dehydrated with an AKI (Scr 2.20 with baseline of 0.97). C. Diff tests negative. GI positive for Norovirus. Pharmacy as been consulted to monitor and replace electrolytes.   Goal of Therapy:  Electrolytes WNL K = 3.1 Mg = 2.0 (1/14) Phos = NA  Plan:  MD replaced 40 mEq PO KCl  No other electrolyte replacement is indicated at this time Will recheck BMP, Mg, and Phos with AM labs  Craven Do, PharmD Pharmacy Resident  08/02/2023 7:43 AM

## 2023-08-02 NOTE — ED Notes (Signed)
 Pt ambulates to bedside toilet w/ steady gait.

## 2023-08-03 DIAGNOSIS — R197 Diarrhea, unspecified: Secondary | ICD-10-CM

## 2023-08-03 DIAGNOSIS — A0811 Acute gastroenteropathy due to Norwalk agent: Secondary | ICD-10-CM | POA: Diagnosis not present

## 2023-08-03 DIAGNOSIS — N179 Acute kidney failure, unspecified: Secondary | ICD-10-CM | POA: Diagnosis not present

## 2023-08-03 DIAGNOSIS — E860 Dehydration: Secondary | ICD-10-CM | POA: Diagnosis not present

## 2023-08-03 LAB — CBC
HCT: 38.2 % (ref 36.0–46.0)
Hemoglobin: 12.5 g/dL (ref 12.0–15.0)
MCH: 30.2 pg (ref 26.0–34.0)
MCHC: 32.7 g/dL (ref 30.0–36.0)
MCV: 92.3 fL (ref 80.0–100.0)
Platelets: 368 10*3/uL (ref 150–400)
RBC: 4.14 MIL/uL (ref 3.87–5.11)
RDW: 13.4 % (ref 11.5–15.5)
WBC: 9.5 10*3/uL (ref 4.0–10.5)
nRBC: 0 % (ref 0.0–0.2)

## 2023-08-03 LAB — BASIC METABOLIC PANEL
Anion gap: 12 (ref 5–15)
BUN: 29 mg/dL — ABNORMAL HIGH (ref 8–23)
CO2: 20 mmol/L — ABNORMAL LOW (ref 22–32)
Calcium: 8.7 mg/dL — ABNORMAL LOW (ref 8.9–10.3)
Chloride: 103 mmol/L (ref 98–111)
Creatinine, Ser: 1.03 mg/dL — ABNORMAL HIGH (ref 0.44–1.00)
GFR, Estimated: >60 mL/min (ref >60)
Glucose, Bld: 110 mg/dL — ABNORMAL HIGH (ref 70–99)
Potassium: 3.2 mmol/L — ABNORMAL LOW (ref 3.5–5.1)
Sodium: 135 mmol/L (ref 135–145)

## 2023-08-03 LAB — PHOSPHORUS: Phosphorus: 1.5 mg/dL — ABNORMAL LOW (ref 2.5–4.6)

## 2023-08-03 LAB — MAGNESIUM: Magnesium: 1.6 mg/dL — ABNORMAL LOW (ref 1.7–2.4)

## 2023-08-03 MED ORDER — MAGNESIUM SULFATE 2 GM/50ML IV SOLN
2.0000 g | Freq: Once | INTRAVENOUS | Status: AC
  Administered 2023-08-03: 2 g via INTRAVENOUS
  Filled 2023-08-03: qty 50

## 2023-08-03 MED ORDER — POTASSIUM CHLORIDE CRYS ER 20 MEQ PO TBCR
40.0000 meq | EXTENDED_RELEASE_TABLET | ORAL | Status: AC
  Administered 2023-08-03: 40 meq via ORAL
  Filled 2023-08-03: qty 2

## 2023-08-03 MED ORDER — POTASSIUM CHLORIDE CRYS ER 20 MEQ PO TBCR: 40.0000 meq | EXTENDED_RELEASE_TABLET | Freq: Once | ORAL | Status: DC

## 2023-08-03 NOTE — Progress Notes (Signed)
PHARMACY CONSULT NOTE - FOLLOW UP  Pharmacy Consult for Electrolyte Monitoring and Replacement   Recent Labs: Potassium (mmol/L)  Date Value  08/03/2023 3.2 (L)  01/20/2014 3.8   Magnesium (mg/dL)  Date Value  04/54/0981 1.6 (L)   Calcium (mg/dL)  Date Value  19/14/7829 8.7 (L)   Calcium, Total (mg/dL)  Date Value  56/21/3086 9.1   Albumin (g/dL)  Date Value  57/84/6962 4.2  01/20/2014 3.3 (L)   Phosphorus (mg/dL)  Date Value  95/28/4132 1.5 (L)   Sodium (mmol/L)  Date Value  08/03/2023 135  01/20/2014 139    Assessment: Carla [name] is a 63 yo female who presented with a global headache Friday evening. She work up with cramping like abdominal pain with nausea and vomiting. Came to the ED Saturday and was sent home with Zofran and Bentyl. Continued to experience diarrhea, nausea/vomiting.  C. Diff tests negative. GI positive for Norovirus. Pharmacy as been consulted to monitor and replace electrolytes.   Goal of Therapy:  Electrolytes WNL  Plan:  40 mEq PO KCl x 1 2 grams IV magnesium sulfate x 1 No other electrolyte replacement is indicated at this time Will recheck BMP, Mg with AM labs  Lowella Bandy, PharmD 08/03/2023 7:23 AM

## 2023-09-01 ENCOUNTER — Encounter: Payer: Self-pay | Admitting: Emergency Medicine

## 2023-09-01 ENCOUNTER — Other Ambulatory Visit: Payer: Self-pay

## 2023-09-01 ENCOUNTER — Emergency Department
Admission: EM | Admit: 2023-09-01 | Discharge: 2023-09-01 | Disposition: A | Discharge status: Home / Self Care | Payer: 59 | Attending: Emergency Medicine | Admitting: Emergency Medicine

## 2023-09-01 DIAGNOSIS — Z20822 Contact with and (suspected) exposure to covid-19: Secondary | ICD-10-CM | POA: Diagnosis not present

## 2023-09-01 DIAGNOSIS — J101 Influenza due to other identified influenza virus with other respiratory manifestations: Secondary | ICD-10-CM | POA: Insufficient documentation

## 2023-09-01 DIAGNOSIS — R0981 Nasal congestion: Secondary | ICD-10-CM | POA: Diagnosis present

## 2023-09-01 LAB — RESP PANEL BY RT-PCR (RSV, FLU A&B, COVID)  RVPGX2
Influenza A by PCR: POSITIVE — AB
Influenza B by PCR: NEGATIVE
Resp Syncytial Virus by PCR: NEGATIVE
SARS Coronavirus 2 by RT PCR: NEGATIVE

## 2023-09-01 LAB — GROUP A STREP BY PCR: Group A Strep by PCR: NOT DETECTED

## 2023-09-01 NOTE — ED Notes (Signed)
See triage note  Presents with sore throat and headache  States sxs' started a few days ago Unsure of fever

## 2023-09-01 NOTE — ED Provider Notes (Signed)
   [facility] Provider Note    Event Date/Time   First MD Initiated Contact with Patient 09/01/23 [phone]     (approximate)   History   Nasal Congestion   HPI  Carla [name] is a 63 y.o. female with history of CAD, hypertension who presents with complaints of nasal congestion, mild headache, fatigue and bodyaches which started earlier this week.  She reports she had to leave work early, she does work third shift.  No shortness of breath reported     Physical Exam   Triage Vital Signs: ED Triage Vitals  Encounter Vitals Group     BP 09/01/23 0357 (!) 162/77     Systolic BP Percentile --      Diastolic BP Percentile --      Pulse Rate 09/01/23 0357 95     Resp 09/01/23 0357 18     Temp 09/01/23 0358 98.6 F (37 C)     Temp Source 09/01/23 0358 Oral     SpO2 09/01/23 0357 100 %     Weight 09/01/23 0403 80 kg (176 lb 5.9 oz)     Height 09/01/23 0403 1.6 m (5\' 3" )     Head Circumference --      Peak Flow --      Pain Score 09/01/23 0400 0     Pain Loc --      Pain Education --      Exclude from Growth Chart --     Most recent vital signs: Vitals:   09/01/23 0357 09/01/23 0358  BP: (!) 162/77   Pulse: 95   Resp: 18   Temp:  98.6 F (37 C)  SpO2: 100%      General: Awake, no distress.  Well-appearing, pleasant CV:  Good peripheral perfusion.  Resp:  Normal effort.  Abd:  No distention.  Other:     ED Results / Procedures / Treatments   Labs (all labs ordered are listed, but only abnormal results are displayed) Labs Reviewed  RESP PANEL BY RT-PCR (RSV, FLU A&B, COVID)  [id number] - Abnormal; Notable for the following components:      Result Value   Influenza A by PCR POSITIVE (*)    All other components within normal limits  GROUP A STREP BY PCR     EKG     RADIOLOGY     PROCEDURES:  Critical Care performed:   Procedures   MEDICATIONS ORDERED IN ED: Medications - No data to display   IMPRESSION / MDM /  ASSESSMENT AND PLAN / ED COURSE  I reviewed the triage vital signs and the nursing notes. Patient's presentation is most consistent with acute illness / injury with system symptoms.  Patient presents with symptoms consistent with likely viral URI, influenza, COVID are prevalent likely at this time  PCR test is positive for influenza A.  The patient is well-appearing with reassuring vital signs, blood pressure in line with prior levels, recommend follow-up with PCP for recheck  Supportive care recommended, work note provided.        FINAL CLINICAL IMPRESSION(S) / ED DIAGNOSES   Final diagnoses:  Influenza A     Rx / DC Orders   ED Discharge Orders     None        Note:  This document was prepared using Dragon voice recognition software and may include unintentional dictation errors.   [name], MD 09/01/23 [phone]

## 2023-09-01 NOTE — ED Triage Notes (Signed)
Patient with cough, congestion since Wednesday. Denies fever,chills, body aches, n/v/d. Reports a scratchy throat.

## 2023-11-23 ENCOUNTER — Other Ambulatory Visit: Payer: Self-pay

## 2023-11-23 DIAGNOSIS — R519 Headache, unspecified: Secondary | ICD-10-CM | POA: Diagnosis present

## 2023-11-23 NOTE — ED Triage Notes (Addendum)
 Patient C/O headache and light sensitivity that began two days ago. Patient denies any history of migraines. She states she took some aspirin  for the pain around 1900.

## 2023-11-24 ENCOUNTER — Emergency Department
Admission: EM | Admit: 2023-11-24 | Discharge: 2023-11-24 | Disposition: A | Discharge status: Home / Self Care | Attending: Emergency Medicine | Admitting: Emergency Medicine

## 2023-11-24 DIAGNOSIS — R519 Headache, unspecified: Secondary | ICD-10-CM

## 2023-11-24 MED ORDER — DROPERIDOL 2.5 MG/ML IJ SOLN: 1.2500 mg | Freq: Once | INTRAMUSCULAR | Status: DC

## 2023-11-24 MED ORDER — DIPHENHYDRAMINE HCL 50 MG/ML IJ SOLN: 12.5000 mg | INTRAMUSCULAR | Status: DC

## 2023-11-24 MED ORDER — SODIUM CHLORIDE 0.9 % IV BOLUS
1000.0000 mL | Freq: Once | INTRAVENOUS | Status: AC
  Administered 2023-11-24: 1000 mL via INTRAVENOUS

## 2023-11-24 MED ORDER — DEXAMETHASONE SODIUM PHOSPHATE 10 MG/ML IJ SOLN
10.0000 mg | Freq: Once | INTRAMUSCULAR | Status: AC
  Administered 2023-11-24: 10 mg via INTRAVENOUS
  Filled 2023-11-24: qty 1

## 2023-11-24 MED ORDER — KETOROLAC TROMETHAMINE 30 MG/ML IJ SOLN
15.0000 mg | Freq: Once | INTRAMUSCULAR | Status: AC
  Administered 2023-11-24: 15 mg via INTRAVENOUS
  Filled 2023-11-24: qty 1

## 2023-11-24 MED ORDER — PROCHLORPERAZINE EDISYLATE 10 MG/2ML IJ SOLN
10.0000 mg | INTRAMUSCULAR | Status: AC
  Administered 2023-11-24: 10 mg via INTRAVENOUS
  Filled 2023-11-24: qty 2

## 2023-11-24 MED ORDER — SODIUM CHLORIDE 0.9 % IV BOLUS: 500.0000 mL | Freq: Once | INTRAVENOUS | Status: DC

## 2023-11-24 MED ORDER — DIPHENHYDRAMINE HCL 50 MG/ML IJ SOLN
25.0000 mg | INTRAMUSCULAR | Status: AC
  Administered 2023-11-24: 25 mg via INTRAVENOUS
  Filled 2023-11-24: qty 1

## 2023-11-24 NOTE — Discharge Instructions (Signed)

## 2023-11-24 NOTE — ED Provider Notes (Signed)
 [facility] Provider Note    Event Date/Time   First MD Initiated Contact with Patient 11/24/23 0222     (approximate)   History   Headache   HPI Carla [name] is a 63 y.o. female who presents for evaluation of an intermittent headache over the last 24 to 48 hours.  She said that she works third shift and it started after her last shift.  It comes and goes but always comes back and is a dull but severe pain mostly in the front of her head.  No visual changes but she does feel sensitive to light.  She denies nausea and vomiting.  No numbness or weakness in any of her extremities.  She said that she often feels like she "stays dehydrated".  No recent changes to dietary habits.  She denies fever, chest pain, shortness of breath, nausea, vomiting, and abdominal pain.     Physical Exam   Triage Vital Signs: ED Triage Vitals  Encounter Vitals Group     BP 11/23/23 2224 139/65     Systolic BP Percentile --      Diastolic BP Percentile --      Pulse Rate 11/23/23 2224 85     Resp 11/23/23 2224 18     Temp 11/23/23 2224 98 F (36.7 C)     Temp Source 11/23/23 2224 Oral     SpO2 11/23/23 2224 100 %     Weight 11/23/23 2225 79.4 kg (175 lb)     Height 11/23/23 2225 1.6 m (5\' 3" )     Head Circumference --      Peak Flow --      Pain Score 11/23/23 2224 5     Pain Loc --      Pain Education --      Exclude from Growth Chart --     Most recent vital signs: Vitals:   11/24/23 0245 11/24/23 0252  BP:  133/60  Pulse: 64   Resp:  18  Temp:    SpO2: 100%     General: Awake,  alert, conversant.  No obvious discomfort.  Pupils are equal and reactive with normal extraocular movement. CV:  Good peripheral perfusion.  Regular rate and rhythm. Resp:  Normal effort. Speaking easily and comfortably, no accessory muscle usage nor intercostal retractions.   Abd:  No distention.  Other:  No focal neurological deficits, patient is moving all 4 extremities without  any difficulty and has no evidence of aphasia nor dysarthria.   ED Results / Procedures / Treatments   Labs (all labs ordered are listed, but only abnormal results are displayed) Labs Reviewed - No data to display   RADIOLOGY See ED course for details   PROCEDURES:  Critical Care performed: No  Procedures    IMPRESSION / MDM / ASSESSMENT AND PLAN / ED COURSE  I reviewed the triage vital signs and the nursing notes.                              Differential diagnosis includes, but is not limited to, migraine, tension headache, stress headache, dehydration, nonspecific headache, neoplasm, intracranial bleed, IIH.  Patient's presentation is most consistent with acute presentation with potential threat to life or bodily function.   Interventions/Medications given:  Medications  ketorolac  (TORADOL ) 30 MG/ML injection 15 mg (15 mg Intravenous Given 11/24/23 0309)  dexamethasone (DECADRON) injection 10 mg (10 mg Intravenous Given 11/24/23 0308)  diphenhydrAMINE (BENADRYL) injection 25 mg (25 mg Intravenous Given 11/24/23 0307)  sodium chloride  0.9 % bolus 1,000 mL (1,000 mLs Intravenous New Bag/Given 11/24/23 0306)  prochlorperazine (COMPAZINE) injection 10 mg (10 mg Intravenous Given 11/24/23 0310)    (Note:  hospital course my include additional interventions and/or labs/studies not listed above.)   Reassuring physical exam, no focal neurodeficits, normal vital signs.  Discussed plan with patient, and we agreed that we would try empiric treatment with medications listed above.  Prochlorperazine 10 mg is being injected into a liter bag of normal saline which will be bolus over an hour to minimize the possibility of side effects.  If she is still having pain after the treatment, we will consider a CT of the head.  She agrees with the plan.    Clinical Course as of 11/24/23 0429  Fri Nov 24, 2023  9811 Patient feels much better and is ready to go home.  Will provide a work note and I  gave my usual and customary return precautions [CF]    Clinical Course User Index [CF] Lynnda Sas, MD     FINAL CLINICAL IMPRESSION(S) / ED DIAGNOSES   Final diagnoses:  Bad headache     Rx / DC Orders   ED Discharge Orders     None        Note:  This document was prepared using Dragon voice recognition software and may include unintentional dictation errors.   Lynnda Sas, MD 11/24/23 [phone]

## 2023-12-02 IMAGING — CT CT ABD-PELV W/ CM
2 of 5 series · 15 of 46 positions shown, 17 images · IV contrast (agent unspecified)
Comparison: Right upper quadrant ultrasound 07/06/2017.

CLINICAL DATA: 60-year-old female with left lower quadrant
abdominal pain for 3 days.

EXAM:
CT ABDOMEN AND PELVIS WITH CONTRAST
TECHNIQUE: Multidetector CT imaging of the abdomen and pelvis was performed
using the standard protocol following bolus administration of
intravenous contrast.

[Series 2: abdomen 5.0 · axial · 0.62mm/px · z∈[+1714,+2124]mm · 12 of 96 slices shown, 14 images]
[im 7/96  soft-tissue]
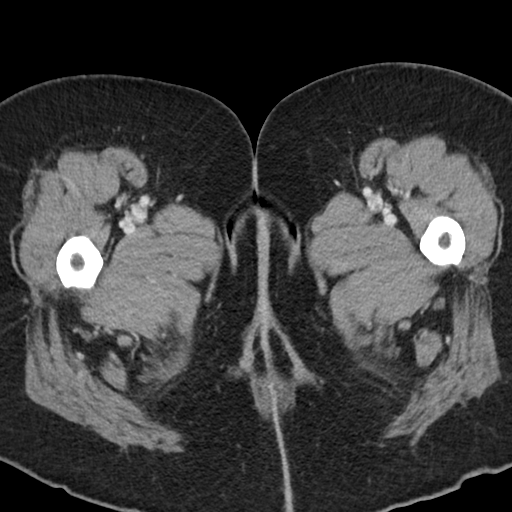
[im 7/96  bone]
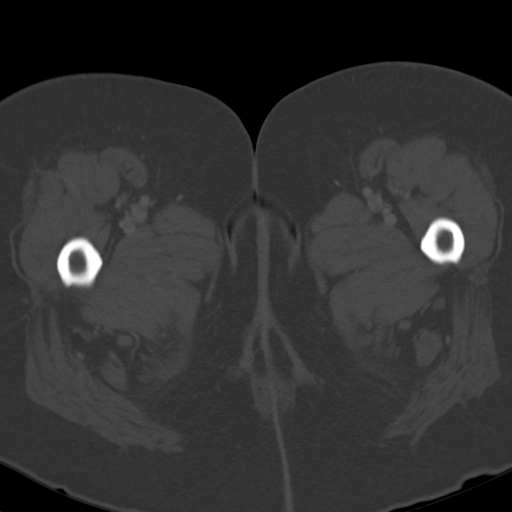
[im 13/96  soft-tissue]
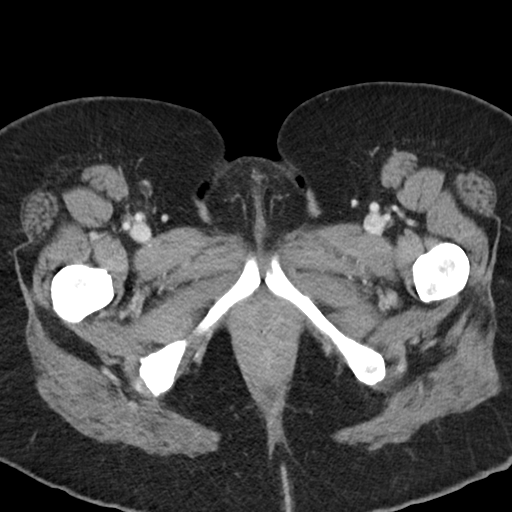
[im 20/96  soft-tissue]
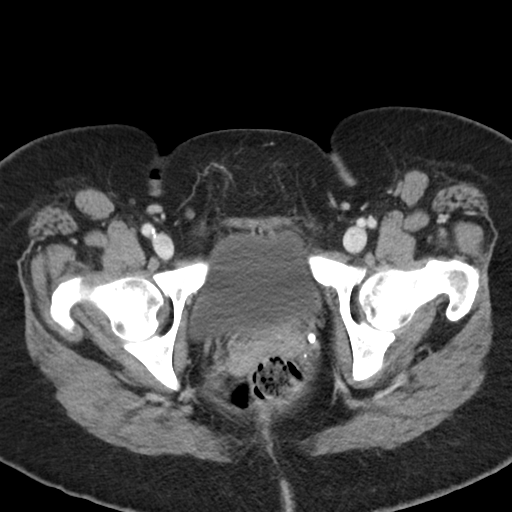
[im 32/96  soft-tissue]
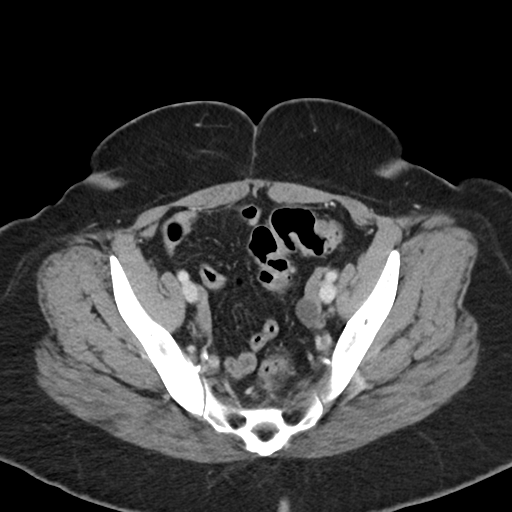
[im 39/96  soft-tissue]
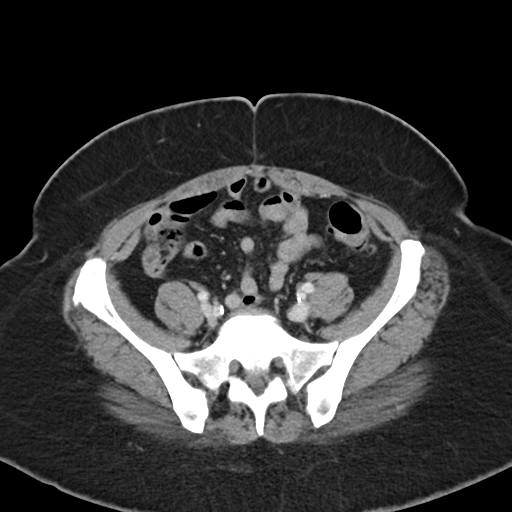
[im 45/96  soft-tissue]
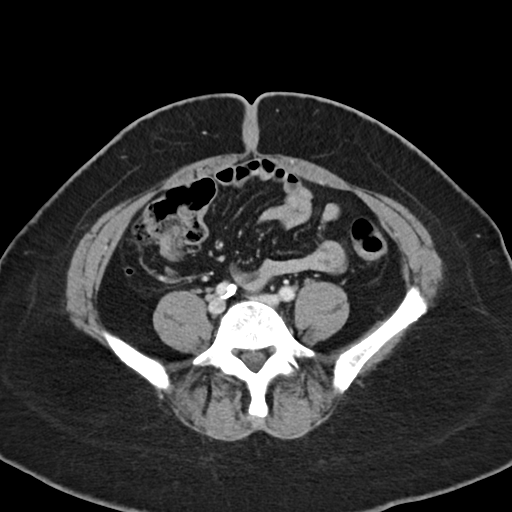
[im 51/96  soft-tissue]
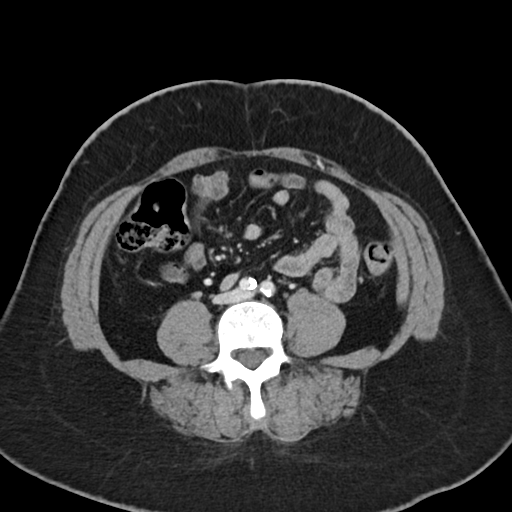
[im 58/96  soft-tissue]
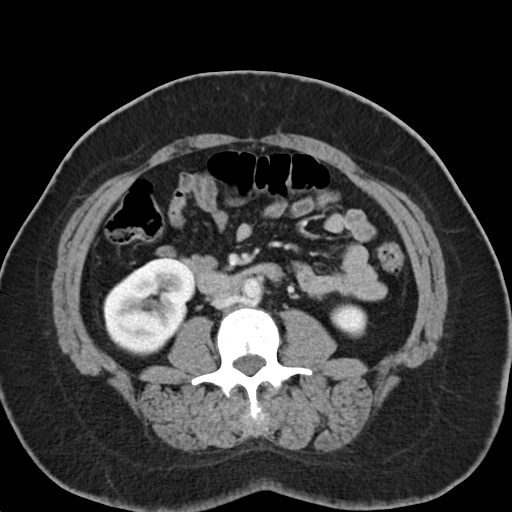
[im 64/96  soft-tissue]
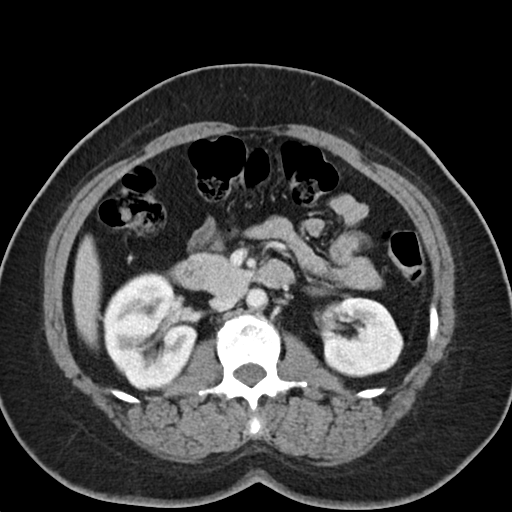
[im 64/96  bone]
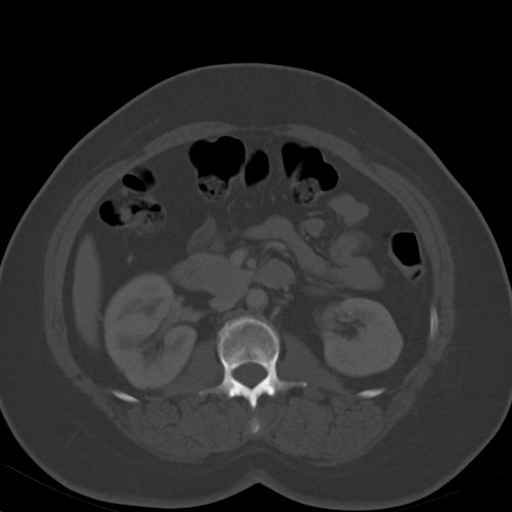
[im 77/96  soft-tissue]
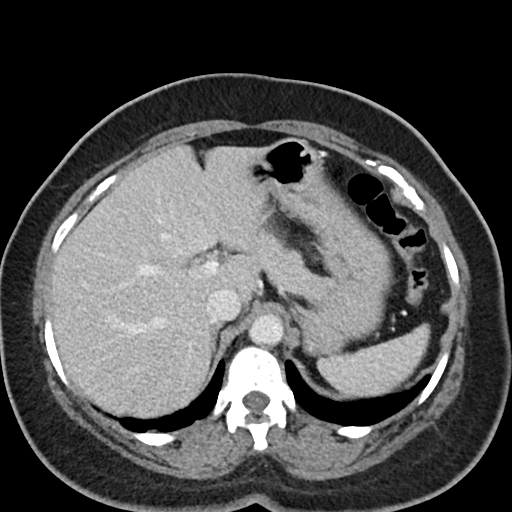
[im 83/96  soft-tissue]
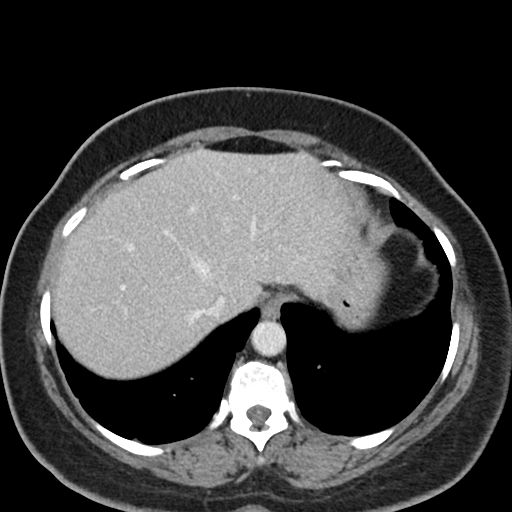
[im 89/96  soft-tissue]
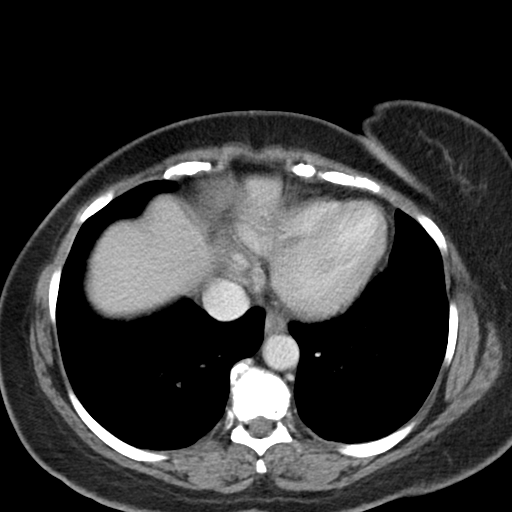

[Series 4: abdomen 3.0 mpr cor · coronal · 0.75mm/px · 3 of 101 slices shown]
[im 34/101  soft-tissue]
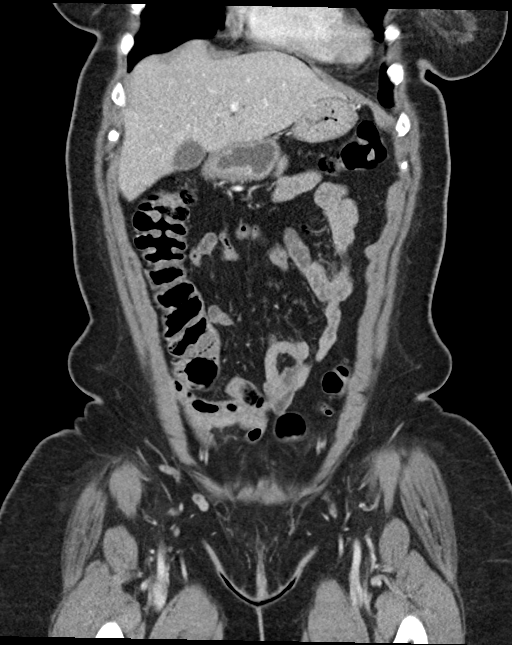
[im 45/101  soft-tissue]
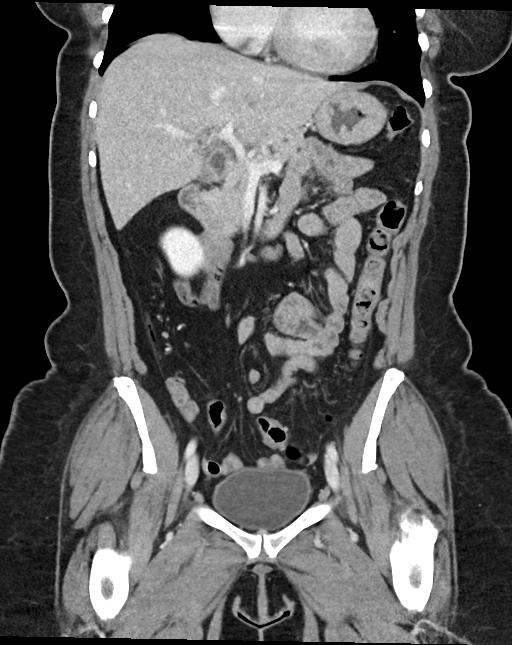
[im 56/101  soft-tissue]
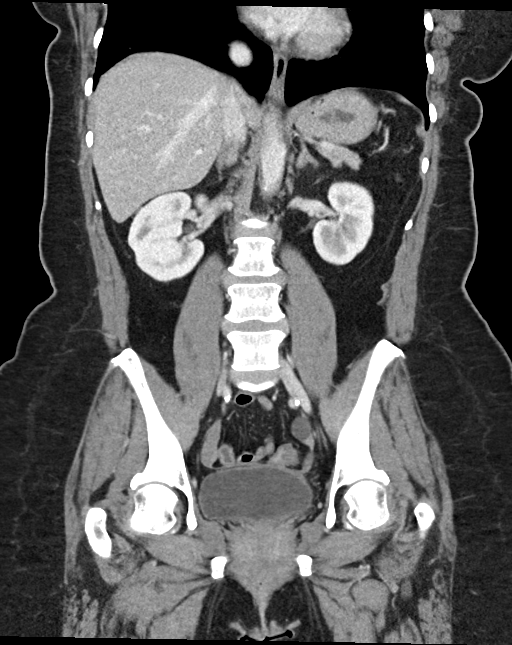

[15 of 46 positions shown; findings below may reference images not displayed]

RADIATION DOSE REDUCTION: This exam was performed according to the
departmental dose-optimization program which includes automated
exposure control, adjustment of the mA and/or kV according to
patient size and/or use of iterative reconstruction technique.

CONTRAST:  100mL OMNIPAQUE IOHEXOL 300 MG/ML  SOLN
FINDINGS: Lower chest: Mild respiratory motion. Minor lung base atelectasis or
scarring. No pericardial or pleural effusion.

Hepatobiliary: Negative liver and gallbladder. Bile ducts within
normal limits.

Pancreas: Negative.  Main pancreatic duct within normal limits.

Spleen: Negative.

Adrenals/Urinary Tract: Negative. Symmetric renal enhancement and
contrast excretion with no nephrolithiasis or pararenal
inflammation. Unremarkable adrenal glands and bladder.

Stomach/Bowel: Retained stool in the rectum. Diverticulosis at the
junction of the descending and sigmoid colon in the left lower
quadrant (coronal image 43), but no convincing active inflammation.
Nondilated upstream descending and transverse colon with mild
retained gas and stool. Normal retrocecal appendix (series 2, image
49). Negative right colon and terminal ileum. No dilated small
bowel. Decompressed stomach and duodenum. No free air, free fluid,
mesenteric inflammation.

Vascular/Lymphatic: Normal caliber abdominal aorta. Aortoiliac
calcified atherosclerosis. Major arterial structures remain patent.
Portal venous system is patent. No lymphadenopathy identified.

Reproductive: Uterus is likely surgically absent. Small
circumscribed and benign appearing left ovarian cyst (1.3 cm series
2, image 65), and vaginal cuff level cyst (1.7 cm sagittal image
59). No follow-up imaging is recommended.

Reference: JACR [DATE]):[id number]

Other: No pelvic free fluid.  Multiple pelvic phleboliths.

Musculoskeletal: No acute osseous abnormality identified.
IMPRESSION: 1. No acute or inflammatory process identified in the abdomen or
pelvis. There is diverticulosis at the junction of the descending
and sigmoid colon in the left lower quadrant, but no convincing
inflammation.

2. Aortic Atherosclerosis ([id number]).

## 2024-03-06 ENCOUNTER — Encounter: Payer: Self-pay | Admitting: Podiatry

## 2024-03-06 ENCOUNTER — Ambulatory Visit (INDEPENDENT_AMBULATORY_CARE_PROVIDER_SITE_OTHER): Admitting: Podiatry

## 2024-03-06 VITALS — Ht 63.0 in | Wt 175.0 lb

## 2024-03-06 DIAGNOSIS — L84 Corns and callosities: Secondary | ICD-10-CM

## 2024-03-06 NOTE — Patient Instructions (Signed)
 More silicone pads can be purchased from:  [email or web address]

## 2024-03-06 NOTE — Progress Notes (Signed)
  Subjective:  Patient ID: VAIL BASISTA, female    DOB: 03/28/1961,  MRN: [id number]  Chief Complaint  Patient presents with   Toe Pain    Rm 3 Patient has a small bump on the lateral side of the 3rd left toe. Patient states receiving a topical ointment and powder from [facility] urgent care center with no relief.    63 y.o. female presents with the above complaint. History confirmed with patient.   Objective:  Physical Exam: warm, good capillary refill, no trophic changes or ulcerative lesions, normal DP and PT pulses, normal sensory exam, and lesser hammertoe deformities and hallux valgus deformity, she has a heloma molle between the third and fourth toes.  Assessment:   1. Heloma molle      Plan:  Patient was evaluated and treated and all questions answered.  All symptomatic hyperkeratoses were safely debrided with a sterile #15 blade to patient's level of comfort without incident. We discussed preventative and palliative care of these lesions including supportive and accommodative shoegear, padding, prefabricated and custom molded accommodative orthoses, use of a pumice stone and lotions/creams daily.  Recommend using a silicone toe spacer.  This was dispensed to her today.  We also discussed surgical correction of this with syndactylization if not improving.  Return to see me as needed. Return if symptoms worsen or fail to improve.

## 2024-04-04 ENCOUNTER — Other Ambulatory Visit: Payer: Self-pay

## 2024-04-04 ENCOUNTER — Emergency Department
Admission: EM | Admit: 2024-04-04 | Discharge: 2024-04-04 | Disposition: A | Discharge status: Home / Self Care | Attending: Emergency Medicine | Admitting: Emergency Medicine

## 2024-04-04 DIAGNOSIS — D72829 Elevated white blood cell count, unspecified: Secondary | ICD-10-CM | POA: Diagnosis not present

## 2024-04-04 DIAGNOSIS — K2900 Acute gastritis without bleeding: Secondary | ICD-10-CM | POA: Insufficient documentation

## 2024-04-04 DIAGNOSIS — E876 Hypokalemia: Secondary | ICD-10-CM | POA: Diagnosis not present

## 2024-04-04 DIAGNOSIS — I10 Essential (primary) hypertension: Secondary | ICD-10-CM | POA: Diagnosis not present

## 2024-04-04 DIAGNOSIS — R1084 Generalized abdominal pain: Secondary | ICD-10-CM

## 2024-04-04 DIAGNOSIS — I2510 Atherosclerotic heart disease of native coronary artery without angina pectoris: Secondary | ICD-10-CM | POA: Insufficient documentation

## 2024-04-04 LAB — URINALYSIS, ROUTINE W REFLEX MICROSCOPIC
Bacteria, UA: NONE SEEN
Bilirubin Urine: NEGATIVE
Glucose, UA: NEGATIVE mg/dL
Ketones, ur: NEGATIVE mg/dL
Leukocytes,Ua: NEGATIVE
Nitrite: NEGATIVE
Protein, ur: NEGATIVE mg/dL
Specific Gravity, Urine: 1.018 (ref 1.005–1.030)
pH: 5 (ref 5.0–8.0)

## 2024-04-04 LAB — COMPREHENSIVE METABOLIC PANEL WITH GFR
ALT: 15 U/L (ref 0–44)
AST: 19 U/L (ref 15–41)
Albumin: 3.9 g/dL (ref 3.5–5.0)
Alkaline Phosphatase: 59 U/L (ref 38–126)
Anion gap: 10 (ref 5–15)
BUN: 14 mg/dL (ref 8–23)
CO2: 26 mmol/L (ref 22–32)
Calcium: 9.9 mg/dL (ref 8.9–10.3)
Chloride: 105 mmol/L (ref 98–111)
Creatinine, Ser: 0.87 mg/dL (ref 0.44–1.00)
GFR, Estimated: >60 mL/min (ref >60)
Glucose, Bld: 100 mg/dL — ABNORMAL HIGH (ref 70–99)
Potassium: 3.2 mmol/L — ABNORMAL LOW (ref 3.5–5.1)
Sodium: 141 mmol/L (ref 135–145)
Total Bilirubin: 0.6 mg/dL (ref 0.0–1.2)
Total Protein: 8.2 g/dL — ABNORMAL HIGH (ref 6.5–8.1)

## 2024-04-04 LAB — LIPASE, BLOOD: Lipase: 24 U/L (ref 11–51)

## 2024-04-04 LAB — CBC
HCT: 35.7 % — ABNORMAL LOW (ref 36.0–46.0)
Hemoglobin: 11.5 g/dL — ABNORMAL LOW (ref 12.0–15.0)
MCH: 29.2 pg (ref 26.0–34.0)
MCHC: 32.2 g/dL (ref 30.0–36.0)
MCV: 90.6 fL (ref 80.0–100.0)
Platelets: 419 K/uL — ABNORMAL HIGH (ref 150–400)
RBC: 3.94 MIL/uL (ref 3.87–5.11)
RDW: 13.9 % (ref 11.5–15.5)
WBC: 13.1 K/uL — ABNORMAL HIGH (ref 4.0–10.5)
nRBC: 0 % (ref 0.0–0.2)

## 2024-04-04 MED ORDER — NAPROXEN 500 MG PO TABS
500.0000 mg | ORAL_TABLET | Freq: Once | ORAL | Status: AC
  Administered 2024-04-04: 500 mg via ORAL
  Filled 2024-04-04: qty 1

## 2024-04-04 MED ORDER — FAMOTIDINE 20 MG PO TABS
40.0000 mg | ORAL_TABLET | Freq: Once | ORAL | Status: AC
  Administered 2024-04-04: 40 mg via ORAL
  Filled 2024-04-04: qty 2

## 2024-04-04 MED ORDER — NAPROXEN 500 MG PO TABS: 500.0000 mg | ORAL_TABLET | Freq: Two times a day (BID) | ORAL | 0 refills | Status: AC

## 2024-04-04 MED ORDER — FAMOTIDINE 20 MG PO TABS: 20.0000 mg | ORAL_TABLET | Freq: Two times a day (BID) | ORAL | 0 refills | Status: AC

## 2024-04-04 MED ORDER — POTASSIUM CHLORIDE CRYS ER 20 MEQ PO TBCR
40.0000 meq | EXTENDED_RELEASE_TABLET | Freq: Once | ORAL | Status: AC
  Administered 2024-04-04: 40 meq via ORAL
  Filled 2024-04-04: qty 2

## 2024-04-04 MED ORDER — DICYCLOMINE HCL 10 MG PO CAPS
20.0000 mg | ORAL_CAPSULE | Freq: Once | ORAL | Status: AC
  Administered 2024-04-04: 20 mg via ORAL
  Filled 2024-04-04: qty 2

## 2024-04-04 MED ORDER — DICYCLOMINE HCL 20 MG PO TABS
20.0000 mg | ORAL_TABLET | Freq: Four times a day (QID) | ORAL | 0 refills | Status: AC | PRN
Start: 2024-04-04 — End: 2024-04-09

## 2024-04-04 NOTE — ED Provider Notes (Signed)
 [facility] Provider Note    Event Date/Time   First MD Initiated Contact with Patient 04/04/24 [phone]     (approximate)   History   Chief Complaint: Abdominal Pain   HPI  Carla [name] is a 63 y.o. female with history of hypertension who comes ED complaining of generalized crampy abdominal pain which is off and on, occurring for the past week.  Worse with eating, no alleviating factors, nonradiating.  Not related to particular kinds of food, last night occurred after eating grapes.  Also reports having loose bowel movements, about twice a day over this time.  Denies black or bloody stool.  No nausea or vomiting, no fever or chills.         Past Medical History:  Diagnosis Date   Coronary artery disease    Hypertension    Myocardial infarct, old     Current Outpatient Rx   Order #: [id number] Class: Normal   Order #: [id number] Class: Normal   Order #: [id number] Class: Normal   Order #: [id number] Class: Historical Med   Order #: [id number] Class: Historical Med   Order #: [id number] Class: Historical Med   Order #: [id number] Class: Normal   Order #: [id number] Class: Historical Med   Order #: [id number] Class: Normal   Order #: [id number] Class: Historical Med    Past Surgical History:  Procedure Laterality Date   ABDOMINAL HYSTERECTOMY     CARPAL TUNNEL RELEASE      Physical Exam   Triage Vital Signs: ED Triage Vitals  Encounter Vitals Group     BP 04/04/24 0856 (!) 153/91     Girls Systolic BP Percentile --      Girls Diastolic BP Percentile --      Boys Systolic BP Percentile --      Boys Diastolic BP Percentile --      Pulse Rate 04/04/24 0856 83     Resp 04/04/24 0856 16     Temp 04/04/24 0856 98.1 F (36.7 C)     Temp Source 04/04/24 0856 Oral     SpO2 04/04/24 0856 100 %     Weight 04/04/24 0857 175 lb (79.4 kg)     Height 04/04/24 0857 5' 3 (1.6 m)     Head Circumference --      Peak Flow --      Pain Score 04/04/24 0856 5      Pain Loc --      Pain Education --      Exclude from Growth Chart --     Most recent vital signs: Vitals:   04/04/24 0856  BP: (!) 153/91  Pulse: 83  Resp: 16  Temp: 98.1 F (36.7 C)  SpO2: 100%    General: Awake, no distress.  CV:  Good peripheral perfusion.  Regular rate rhythm Resp:  Normal effort.  Abd:  No distention.  Soft without focal tenderness. Other:  Moist oral mucosa   ED Results / Procedures / Treatments   Labs (all labs ordered are listed, but only abnormal results are displayed) Labs Reviewed  COMPREHENSIVE METABOLIC PANEL WITH GFR - Abnormal; Notable for the following components:      Result Value   Potassium 3.2 (*)    Glucose, Bld 100 (*)    Total Protein 8.2 (*)    All other components within normal limits  CBC - Abnormal; Notable for the following components:   WBC 13.1 (*)    Hemoglobin 11.5 (*)    HCT 35.7 (*)  Platelets 419 (*)    All other components within normal limits  URINALYSIS, ROUTINE W REFLEX MICROSCOPIC - Abnormal; Notable for the following components:   Color, Urine YELLOW (*)    APPearance HAZY (*)    Hgb urine dipstick MODERATE (*)    All other components within normal limits  LIPASE, BLOOD     EKG    RADIOLOGY    PROCEDURES:  Procedures   MEDICATIONS ORDERED IN ED: Medications  dicyclomine  (BENTYL ) capsule 20 mg (20 mg Oral Given 04/04/24 0951)  famotidine  (PEPCID ) tablet 40 mg (40 mg Oral Given 04/04/24 0951)  naproxen  (NAPROSYN ) tablet 500 mg (500 mg Oral Given 04/04/24 0951)  potassium chloride  SA (KLOR-CON  M) CR tablet 40 mEq (40 mEq Oral Given 04/04/24 0952)     IMPRESSION / MDM / ASSESSMENT AND PLAN / ED COURSE  I reviewed the triage vital signs and the nursing notes.  DDx: Dehydration, AKI electrolyte derangement, viral gastroenteritis, UTI  Patient's presentation is most consistent with acute presentation with potential threat to life or bodily function.  Patient presents with crampy abdominal  pain over the past week with some loose bowel movements.  Suspect gastritis versus viral illness.  Will provide supportive care, check labs.   ----------------------------------------- 10:38 AM on 04/04/2024 ----------------------------------------- Symptoms resolved, tolerating oral intake.  Labs reassuring.  Doubt appendicitis, cholecystitis, diverticulitis, bowel obstruction, hernia, vascular injury.  Stable for discharge with continued supportive care at home.      FINAL CLINICAL IMPRESSION(S) / ED DIAGNOSES   Final diagnoses:  Generalized abdominal pain  Acute gastritis without hemorrhage, unspecified gastritis type     Rx / DC Orders   ED Discharge Orders          Ordered    naproxen  (NAPROSYN ) 500 MG tablet  2 times daily with meals        04/04/24 1038    dicyclomine  (BENTYL ) 20 MG tablet  Every 6 hours PRN        04/04/24 1038    famotidine  (PEPCID ) 20 MG tablet  2 times daily        04/04/24 1038             Note:  This document was prepared using Dragon voice recognition software and may include unintentional dictation errors.   Viviann Pastor, MD 04/04/24 1039

## 2024-04-04 NOTE — ED Triage Notes (Signed)
 Pt c/o abd pain x3 days with cramping and diarrhea. Pt reports hx of hypertension. Pt denies any n/v
